# Patient Record
Sex: Female | Born: 1990 | Race: Black or African American | Hispanic: No | Marital: Single
Health system: Midwestern US, Community
[De-identification: ages and names within clinical notes are randomized; demographics above are authoritative.]

## PROBLEM LIST (undated history)

## (undated) DIAGNOSIS — G039 Meningitis, unspecified: Secondary | ICD-10-CM

## (undated) DIAGNOSIS — R0789 Other chest pain: Principal | ICD-10-CM

---
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
## 2012-09-27 ENCOUNTER — Emergency Department (HOSPITAL_COMMUNITY)
Admission: EM | Admit: 2012-09-27 | Discharge: 2012-09-27 | Disposition: A | Discharge status: Home / Self Care | Payer: No Typology Code available for payment source | Attending: Emergency Medicine | Admitting: Emergency Medicine

## 2012-09-27 ENCOUNTER — Encounter (HOSPITAL_COMMUNITY): Payer: Self-pay | Admitting: Emergency Medicine

## 2012-09-27 ENCOUNTER — Emergency Department (HOSPITAL_COMMUNITY): Payer: No Typology Code available for payment source

## 2012-09-27 DIAGNOSIS — IMO0002 Reserved for concepts with insufficient information to code with codable children: Secondary | ICD-10-CM | POA: Insufficient documentation

## 2012-09-27 DIAGNOSIS — Y9389 Activity, other specified: Secondary | ICD-10-CM | POA: Insufficient documentation

## 2012-09-27 DIAGNOSIS — S39012A Strain of muscle, fascia and tendon of lower back, initial encounter: Secondary | ICD-10-CM

## 2012-09-27 MED ORDER — OXYCODONE-ACETAMINOPHEN 5-325 MG PO TABS
2.0000 | ORAL_TABLET | Freq: Once | ORAL | Status: AC
  Administered 2012-09-27: 2 via ORAL
  Filled 2012-09-27: qty 2

## 2012-09-27 MED ORDER — IBUPROFEN 400 MG PO TABS
400.0000 mg | ORAL_TABLET | Freq: Once | ORAL | Status: AC
  Administered 2012-09-27: 400 mg via ORAL
  Filled 2012-09-27: qty 1

## 2012-09-27 MED ORDER — OXYCODONE-ACETAMINOPHEN 5-325 MG PO TABS: 2.0000 | ORAL_TABLET | Freq: Three times a day (TID) | ORAL | Status: DC | PRN
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
## 2014-05-25 ENCOUNTER — Emergency Department (HOSPITAL_COMMUNITY)
Admission: EM | Admit: 2014-05-25 | Discharge: 2014-05-25 | Disposition: A | Discharge status: Home / Self Care | Payer: PRIVATE HEALTH INSURANCE | Attending: Emergency Medicine | Admitting: Emergency Medicine

## 2014-05-25 ENCOUNTER — Emergency Department (HOSPITAL_COMMUNITY): Payer: PRIVATE HEALTH INSURANCE

## 2014-05-25 ENCOUNTER — Encounter (HOSPITAL_COMMUNITY): Payer: Self-pay | Admitting: Emergency Medicine

## 2014-05-25 DIAGNOSIS — W230XXA Caught, crushed, jammed, or pinched between moving objects, initial encounter: Secondary | ICD-10-CM | POA: Insufficient documentation

## 2014-05-25 DIAGNOSIS — S61209A Unspecified open wound of unspecified finger without damage to nail, initial encounter: Secondary | ICD-10-CM | POA: Diagnosis not present

## 2014-05-25 DIAGNOSIS — S6000XA Contusion of unspecified finger without damage to nail, initial encounter: Secondary | ICD-10-CM | POA: Insufficient documentation

## 2014-05-25 DIAGNOSIS — Y9389 Activity, other specified: Secondary | ICD-10-CM | POA: Insufficient documentation

## 2014-05-25 DIAGNOSIS — S6990XA Unspecified injury of unspecified wrist, hand and finger(s), initial encounter: Secondary | ICD-10-CM | POA: Diagnosis present

## 2014-05-25 DIAGNOSIS — S6980XA Other specified injuries of unspecified wrist, hand and finger(s), initial encounter: Secondary | ICD-10-CM | POA: Diagnosis present

## 2014-05-25 DIAGNOSIS — Y9289 Other specified places as the place of occurrence of the external cause: Secondary | ICD-10-CM | POA: Diagnosis not present

## 2014-05-25 DIAGNOSIS — S6010XA Contusion of unspecified finger with damage to nail, initial encounter: Secondary | ICD-10-CM

## 2014-05-25 DIAGNOSIS — S61012A Laceration without foreign body of left thumb without damage to nail, initial encounter: Secondary | ICD-10-CM

## 2014-05-25 MED ORDER — HYDROMORPHONE HCL PF 1 MG/ML IJ SOLN
1.0000 mg | Freq: Once | INTRAMUSCULAR | Status: AC
  Administered 2014-05-25: 1 mg via INTRAMUSCULAR
  Filled 2014-05-25: qty 1

## 2014-05-25 MED ORDER — ACETAMINOPHEN 325 MG PO TABS
650.0000 mg | ORAL_TABLET | Freq: Once | ORAL | Status: AC
  Administered 2014-05-25: 650 mg via ORAL
  Filled 2014-05-25: qty 2

## 2014-05-25 MED ORDER — OXYCODONE-ACETAMINOPHEN 5-325 MG PO TABS: 1.0000 | ORAL_TABLET | Freq: Four times a day (QID) | ORAL | Status: DC | PRN

## 2014-05-25 MED ORDER — OXYCODONE-ACETAMINOPHEN 5-325 MG PO TABS
1.0000 | ORAL_TABLET | Freq: Once | ORAL | Status: AC
  Administered 2014-05-25: 1 via ORAL
  Filled 2014-05-25: qty 1

## 2014-05-25 NOTE — ED Notes (Signed)
Pt. accidentally injured her left thumb against a door this evening  , presents with pain / swelling of left thumb.

## 2014-05-25 NOTE — ED Provider Notes (Signed)
CSN: [id number]     Arrival date & time 05/25/14  0215 History   First MD Initiated Contact with Patient 05/25/14 [phone]     Chief Complaint  Patient presents with  . Finger Injury     (Consider location/radiation/quality/duration/timing/severity/associated sxs/prior Treatment) Patient is a 23 y.o. female presenting with hand injury. The history is provided by the patient.  Hand Injury Location:  Finger Time since incident:  1 hour Injury: yes   Mechanism of injury: crush   Crush injury:    Mechanism:  Door Finger location:  L thumb Pain details:    Quality:  Aching   Radiates to:  Does not radiate   Severity:  Severe   Onset quality:  Sudden   Duration:  1 hour   Timing:  Constant   Progression:  Unchanged Chronicity:  New Handedness:  Right-handed Dislocation: no   Foreign body present:  No foreign bodies Tetanus status:  Up to date Prior injury to area:  No Relieved by:  Nothing Worsened by:  Nothing tried Ineffective treatments:  None tried Associated symptoms: no back pain, no fatigue, no fever and no neck pain     History reviewed. No pertinent past medical history. History reviewed. No pertinent past surgical history. No family history on file. History  Substance Use Topics  . Smoking status: Never Smoker   . Smokeless tobacco: Not on file  . Alcohol Use: Yes   OB History   Grav Para Term Preterm Abortions TAB SAB Ect Mult Living                 Review of Systems  Constitutional: Negative for fever and fatigue.  HENT: Negative for congestion and drooling.   Eyes: Negative for pain.  Respiratory: Negative for cough and shortness of breath.   Cardiovascular: Negative for chest pain.  Gastrointestinal: Negative for nausea, vomiting, abdominal pain and diarrhea.  Genitourinary: Negative for dysuria and hematuria.  Musculoskeletal: Negative for back pain, gait problem and neck pain.  Skin: Negative for color change.  Neurological: Negative for dizziness  and headaches.  Hematological: Negative for adenopathy.  Psychiatric/Behavioral: Negative for behavioral problems.  All other systems reviewed and are negative.     Allergies  Hydrocodone  Home Medications   Prior to Admission medications   Medication Sig Start Date End Date Taking? Authorizing Provider  oxyCODONE-acetaminophen (PERCOCET) 5-325 MG per tablet Take 2 tablets by mouth every 8 (eight) hours as needed for pain. 09/27/12   Hurman HornJohn M Bednar, MD   BP 112/76  Pulse 107  Temp(Src) 98.7 F (37.1 C) (Oral)  Resp 14  SpO2 100%  LMP 05/09/2014 Physical Exam  Nursing note and vitals reviewed. Constitutional: She is oriented to person, place, and time. She appears well-developed and well-nourished.  HENT:  Head: Normocephalic and atraumatic.  Mouth/Throat: Oropharynx is clear and moist. No oropharyngeal exudate.  Eyes: Conjunctivae and EOM are normal. Pupils are equal, round, and reactive to light.  Neck: Normal range of motion. Neck supple.  Cardiovascular: Normal rate, regular rhythm, normal heart sounds and intact distal pulses.  Exam reveals no gallop and no friction rub.   No murmur heard. Pulmonary/Chest: Effort normal and breath sounds normal. No respiratory distress. She has no wheezes.  Abdominal: Soft. Bowel sounds are normal. There is no tenderness. There is no rebound and no guarding.  Musculoskeletal: Normal range of motion. She exhibits no edema and no tenderness.  1.5 cm superficial well approximated laceration to the distal phalanx of the  left thumb on the bowl or aspect.  Subungual hematoma is noted beneath the nail plate of the left thumb. It occupies approximately 70% of the nailbed.  No obvious motor or sensory deficits in the left upper extremity. 2+ distal pulses in the left upper extremity.  Neurological: She is alert and oriented to person, place, and time.  Skin: Skin is warm and dry.  Psychiatric: She has a normal mood and affect. Her behavior is  normal.    ED Course  Procedures (including critical care time) Labs Review Labs Reviewed - No data to display  Imaging Review Dg Finger Thumb Left  05/25/2014   CLINICAL DATA:  Pain MCP joint after shutting left thumb in car door.  EXAM: LEFT THUMB 2+V  COMPARISON:  None.  FINDINGS: There is no evidence of fracture or dislocation. There is no evidence of arthropathy or other focal bone abnormality. Soft tissues are unremarkable  IMPRESSION: Negative.   Electronically Signed   By: Burman Nieves M.D.   On: 05/25/2014 03:02     EKG Interpretation None      MDM   Final diagnoses:  Laceration of left thumb, initial encounter  Subungual hematoma of digit of hand, initial encounter    3:34 AM 23 y.o. female who presents after crashing her left thumb in a door prior to arrival. Her exam is typical as she is in a lot of pain. Will give her a dose of IM Dilaudid. She appears to have a subungual hematoma as well as a 1.5 cm laceration on the volar aspect of the thumb. She believes her tetanus to be up-to-date.  5:54 AM: Laceration does not need repair as it is superficial and well approximated. Plain film non-contrib. Pt elected not to have trephination of the subungual hematoma.  I have discussed the diagnosis/risks/treatment options with the patient and believe the pt to be eligible for discharge home to follow-up with pcp as needed. We also discussed returning to the ED immediately if new or worsening sx occur. We discussed the sx which are most concerning (e.g., worsening pain, concern for infection) that necessitate immediate return. Medications administered to the patient during their visit and any new prescriptions provided to the patient are listed below.  Medications given during this visit Medications  acetaminophen (TYLENOL) tablet 650 mg (650 mg Oral Given 05/25/14 0326)  HYDROmorphone (DILAUDID) injection 1 mg (1 mg Intramuscular Given 05/25/14 0350)  oxyCODONE-acetaminophen  (PERCOCET/ROXICET) 5-325 MG per tablet 1 tablet (1 tablet Oral Given 05/25/14 0451)    There are no discharge medications for this patient.    Junius Argyle, MD 05/25/14 [phone]

## 2014-05-31 ENCOUNTER — Emergency Department (HOSPITAL_COMMUNITY)
Admission: EM | Admit: 2014-05-31 | Discharge: 2014-05-31 | Disposition: A | Discharge status: Home / Self Care | Payer: PRIVATE HEALTH INSURANCE | Attending: Emergency Medicine | Admitting: Emergency Medicine

## 2014-05-31 ENCOUNTER — Encounter (HOSPITAL_COMMUNITY): Payer: Self-pay | Admitting: Emergency Medicine

## 2014-05-31 DIAGNOSIS — Y9389 Activity, other specified: Secondary | ICD-10-CM | POA: Diagnosis not present

## 2014-05-31 DIAGNOSIS — S6000XA Contusion of unspecified finger without damage to nail, initial encounter: Secondary | ICD-10-CM | POA: Diagnosis not present

## 2014-05-31 DIAGNOSIS — Y9289 Other specified places as the place of occurrence of the external cause: Secondary | ICD-10-CM | POA: Insufficient documentation

## 2014-05-31 DIAGNOSIS — S60112A Contusion of left thumb with damage to nail, initial encounter: Secondary | ICD-10-CM

## 2014-05-31 DIAGNOSIS — W230XXA Caught, crushed, jammed, or pinched between moving objects, initial encounter: Secondary | ICD-10-CM | POA: Diagnosis not present

## 2014-05-31 DIAGNOSIS — S6990XA Unspecified injury of unspecified wrist, hand and finger(s), initial encounter: Secondary | ICD-10-CM | POA: Diagnosis present

## 2014-05-31 DIAGNOSIS — S6980XA Other specified injuries of unspecified wrist, hand and finger(s), initial encounter: Secondary | ICD-10-CM | POA: Diagnosis present

## 2014-05-31 MED ORDER — OXYCODONE-ACETAMINOPHEN 5-325 MG PO TABS: 1.0000 | ORAL_TABLET | ORAL | Status: DC | PRN

## 2014-05-31 MED ORDER — ACETAMINOPHEN 325 MG PO TABS
650.0000 mg | ORAL_TABLET | Freq: Once | ORAL | Status: AC
  Administered 2014-05-31: 650 mg via ORAL
  Filled 2014-05-31: qty 2

## 2014-05-31 MED ORDER — CEPHALEXIN 500 MG PO CAPS: 500.0000 mg | ORAL_CAPSULE | Freq: Four times a day (QID) | ORAL | Status: DC

## 2014-05-31 NOTE — ED Provider Notes (Signed)
Medical screening examination/treatment/procedure(s) were performed by non-physician practitioner and as supervising physician I was immediately available for consultation/collaboration.  Toy BakerAnthony T Nanako Stopher, MD 05/31/14 [phone]

## 2014-05-31 NOTE — ED Notes (Signed)
Pt states last Wednesday shut L thumb in car door, was seen at [place], given pain medication and a prescription, was told if doesn't get any better then can get it drained, pt states still in a lot of pain and it's still swollen

## 2014-05-31 NOTE — ED Provider Notes (Signed)
CSN: [id number]     Arrival date & time 05/31/14  0810 History   First MD Initiated Contact with Patient 05/31/14 0818     Chief Complaint  Patient presents with  . Finger Injury    L thumb     (Consider location/radiation/quality/duration/timing/severity/associated sxs/prior Treatment) HPI Comments: Patient is a 23 year old female who presents with left thumb pain for 1 week. Patient reports shutting her thumb in a car door 1 week ago. She was seen at [facility] ED where she had an unremarkable xray. Patient was sent home with pain medication and instructed to return to have her thumb drained if the pain does not improve. Patient reports continued throbbing and severe pain of her left thumb that does not radiate. No new injury. Palpation and movement makes the pain worse. No alleviating factors.    History reviewed. No pertinent past medical history. History reviewed. No pertinent past surgical history. No family history on file. History  Substance Use Topics  . Smoking status: Never Smoker   . Smokeless tobacco: Not on file  . Alcohol Use: Yes   OB History   Grav Para Term Preterm Abortions TAB SAB Ect Mult Living                 Review of Systems  Constitutional: Negative for fever, chills and fatigue.  HENT: Negative for trouble swallowing.   Eyes: Negative for visual disturbance.  Respiratory: Negative for shortness of breath.   Cardiovascular: Negative for chest pain and palpitations.  Gastrointestinal: Negative for nausea, vomiting, abdominal pain and diarrhea.  Genitourinary: Negative for dysuria and difficulty urinating.  Musculoskeletal: Positive for arthralgias and joint swelling. Negative for neck pain.  Skin: Negative for color change.  Neurological: Negative for dizziness and weakness.  Psychiatric/Behavioral: Negative for dysphoric mood.      Allergies  Apple fruit extract and Hydrocodone  Home Medications   Prior to Admission medications   Medication  Sig Start Date End Date Taking? Authorizing Provider  ibuprofen (ADVIL,MOTRIN) 200 MG tablet Take 400 mg by mouth every 6 (six) hours as needed for moderate pain.   Yes Historical Provider, MD  oxyCODONE-acetaminophen (PERCOCET) 5-325 MG per tablet Take 1 tablet by mouth every 6 (six) hours as needed for moderate pain. 05/25/14  Yes Junius Argyle, MD   BP 112/72  Pulse 74  Temp(Src) 97.9 F (36.6 C) (Oral)  Resp 18  Ht 5\' 3"  (1.6 m)  Wt 134 lb (60.782 kg)  BMI 23.74 kg/m2  SpO2 100%  LMP 05/09/2014 Physical Exam  Nursing note and vitals reviewed. Constitutional: She appears well-developed and well-nourished. No distress.  HENT:  Head: Normocephalic and atraumatic.  Eyes: Conjunctivae and EOM are normal.  Neck: Normal range of motion.  Cardiovascular: Normal rate and regular rhythm.  Exam reveals no gallop and no friction rub.   No murmur heard. Pulmonary/Chest: Effort normal and breath sounds normal. She has no wheezes. She has no rales. She exhibits no tenderness.  Musculoskeletal:  Generalized edema and erythema of left thumb. Limited ROM of left thumb due to pain. Generalized tenderness to palpation of left thumb. subungual hematoma noted of left thumb.   Neurological: She is alert. Coordination normal.  Speech is goal-oriented. Moves limbs without ataxia.   Skin: Skin is warm and dry.  There is a healed laceration of finger pad of left thumb.   Psychiatric: She has a normal mood and affect. Her behavior is normal.    ED Course  Procedures (including critical care time)   PROCEDURE NOTE for left thumb nail trephination. Performed 9:24 AM on 05/31/2014 Performed by Emilia BeckKaitlyn Ervin Rothbauer I used a high temperature, fine tip cautery to place 2 holes in left thumb nail bed Patient reports immediate relief No complications Drainage is serosanguinous   Labs Review Labs Reviewed - No data to display  Imaging Review No results found.   EKG Interpretation None      MDM    Final diagnoses:  Subungual hematoma of left thumb, initial encounter    9:18 AM Patient's hematoma drained without complication. Patient will have antibiotics and tylenol here. Patient will be discharged with Percocet.       Emilia BeckKaitlyn Hania Cerone, PA-C 05/31/14 [phone]

## 2014-05-31 NOTE — Discharge Instructions (Signed)
Take keflex as directed until gone. Take Percocet as needed for pain. Return to the ED with worsening or concerning symptoms.

## 2014-05-31 NOTE — ED Notes (Signed)
Patient was educated not to drive, operate heavy machinery, or drink alcohol while taking narcotic medication.  

## 2014-11-13 ENCOUNTER — Encounter (HOSPITAL_COMMUNITY): Payer: Self-pay

## 2014-11-13 ENCOUNTER — Emergency Department (HOSPITAL_COMMUNITY)
Admission: EM | Admit: 2014-11-13 | Discharge: 2014-11-13 | Disposition: A | Discharge status: Home / Self Care | Payer: 59 | Attending: Emergency Medicine | Admitting: Emergency Medicine

## 2014-11-13 ENCOUNTER — Emergency Department (HOSPITAL_COMMUNITY): Payer: 59

## 2014-11-13 DIAGNOSIS — Z792 Long term (current) use of antibiotics: Secondary | ICD-10-CM | POA: Insufficient documentation

## 2014-11-13 DIAGNOSIS — N946 Dysmenorrhea, unspecified: Secondary | ICD-10-CM | POA: Insufficient documentation

## 2014-11-13 DIAGNOSIS — R1032 Left lower quadrant pain: Secondary | ICD-10-CM | POA: Diagnosis present

## 2014-11-13 DIAGNOSIS — Z3202 Encounter for pregnancy test, result negative: Secondary | ICD-10-CM | POA: Insufficient documentation

## 2014-11-13 DIAGNOSIS — R109 Unspecified abdominal pain: Secondary | ICD-10-CM

## 2014-11-13 LAB — CBC WITH DIFFERENTIAL/PLATELET
BASOS ABS: 0 10*3/uL (ref 0.0–0.1)
Basophils Relative: 0 % (ref 0–1)
Eosinophils Absolute: 0.2 10*3/uL (ref 0.0–0.7)
Eosinophils Relative: 2 % (ref 0–5)
HCT: 36.5 % (ref 36.0–46.0)
Hemoglobin: 12.1 g/dL (ref 12.0–15.0)
LYMPHS ABS: 1.1 10*3/uL (ref 0.7–4.0)
LYMPHS PCT: 13 % (ref 12–46)
MCH: 29.7 pg (ref 26.0–34.0)
MCHC: 33.2 g/dL (ref 30.0–36.0)
MCV: 89.5 fL (ref 78.0–100.0)
Monocytes Absolute: 0.4 10*3/uL (ref 0.1–1.0)
Monocytes Relative: 5 % (ref 3–12)
NEUTROS ABS: 6.8 10*3/uL (ref 1.7–7.7)
Neutrophils Relative %: 80 % — ABNORMAL HIGH (ref 43–77)
PLATELETS: 247 10*3/uL (ref 150–400)
RBC: 4.08 MIL/uL (ref 3.87–5.11)
RDW: 12.8 % (ref 11.5–15.5)
WBC: 8.5 10*3/uL (ref 4.0–10.5)

## 2014-11-13 LAB — URINE MICROSCOPIC-ADD ON

## 2014-11-13 LAB — URINALYSIS, ROUTINE W REFLEX MICROSCOPIC
BILIRUBIN URINE: NEGATIVE
GLUCOSE, UA: NEGATIVE mg/dL
Ketones, ur: NEGATIVE mg/dL
Leukocytes, UA: NEGATIVE
Nitrite: NEGATIVE
PROTEIN: NEGATIVE mg/dL
Specific Gravity, Urine: 1.028 (ref 1.005–1.030)
Urobilinogen, UA: 1 mg/dL (ref 0.0–1.0)
pH: 6 (ref 5.0–8.0)

## 2014-11-13 LAB — COMPREHENSIVE METABOLIC PANEL
ALK PHOS: 52 U/L (ref 39–117)
ALT: 10 U/L (ref 0–35)
AST: 17 U/L (ref 0–37)
Albumin: 4.6 g/dL (ref 3.5–5.2)
Anion gap: 7 (ref 5–15)
BILIRUBIN TOTAL: 1 mg/dL (ref 0.3–1.2)
BUN: 11 mg/dL (ref 6–23)
CALCIUM: 9.2 mg/dL (ref 8.4–10.5)
CHLORIDE: 107 meq/L (ref 96–112)
CO2: 24 mmol/L (ref 19–32)
Creatinine, Ser: 0.66 mg/dL (ref 0.50–1.10)
GFR calc Af Amer: >90 mL/min (ref >90)
GLUCOSE: 101 mg/dL — AB (ref 70–99)
POTASSIUM: 3.7 mmol/L (ref 3.5–5.1)
SODIUM: 138 mmol/L (ref 135–145)
Total Protein: 8 g/dL (ref 6.0–8.3)

## 2014-11-13 LAB — WET PREP, GENITAL
Clue Cells Wet Prep HPF POC: NONE SEEN
Trich, Wet Prep: NONE SEEN
Yeast Wet Prep HPF POC: NONE SEEN

## 2014-11-13 LAB — PREGNANCY, URINE: PREG TEST UR: NEGATIVE

## 2014-11-13 MED ORDER — IBUPROFEN 800 MG PO TABS: 800.0000 mg | ORAL_TABLET | Freq: Three times a day (TID) | ORAL | Status: DC

## 2014-11-13 NOTE — ED Notes (Signed)
PER [facility]# 16. Pt called EMS for c/o lower abdominal pain x 5 hours. Denies NVD. VS stable

## 2014-11-13 NOTE — ED Provider Notes (Signed)
CSN: [id number]     Arrival date & time 11/13/14  1253 History   First MD Initiated Contact with Patient 11/13/14 1537     Chief Complaint  Patient presents with  . Abdominal Pain    5 hour hx , c/o right and l/lower quad abdominal pain. Denies NVD     (Consider location/radiation/quality/duration/timing/severity/associated sxs/prior Treatment) Patient is a 23 y.o. female presenting with abdominal pain. The history is provided by the patient.  Abdominal Pain Pain location:  Suprapubic, LLQ and RLQ Pain quality: aching and sharp   Pain severity:  Severe Onset quality:  Sudden Timing:  Constant Progression:  Resolved Chronicity:  New Context: not recent sexual activity, not retching and not trauma   Context comment:  Began her menstrual cycle this morning Relieved by:  Nothing Worsened by:  Nothing tried Ineffective treatments:  NSAIDs Associated symptoms: no cough, no fever, no shortness of breath and no vomiting     History reviewed. No pertinent past medical history. History reviewed. No pertinent past surgical history. History reviewed. No pertinent family history. History  Substance Use Topics  . Smoking status: Never Smoker   . Smokeless tobacco: Not on file  . Alcohol Use: Yes   OB History    No data available     Review of Systems  Constitutional: Negative for fever.  Respiratory: Negative for cough and shortness of breath.   Gastrointestinal: Positive for abdominal pain. Negative for vomiting.  All other systems reviewed and are negative.     Allergies  Apple fruit extract and Hydrocodone  Home Medications   Prior to Admission medications   Medication Sig Start Date End Date Taking? Authorizing Provider  ibuprofen (ADVIL,MOTRIN) 200 MG tablet Take 400 mg by mouth every 6 (six) hours as needed for moderate pain (menstrual cramps).    Yes Historical Provider, MD  cephALEXin (KEFLEX) 500 MG capsule Take 1 capsule (500 mg total) by mouth 4 (four) times  daily. Patient not taking: Reported on 11/13/2014 05/31/14   Emilia Beck, PA-C  oxyCODONE-acetaminophen (PERCOCET) 5-325 MG per tablet Take 1 tablet by mouth every 6 (six) hours as needed for moderate pain. 05/25/14   Purvis Sheffield, MD  oxyCODONE-acetaminophen (PERCOCET/ROXICET) 5-325 MG per tablet Take 1-2 tablets by mouth every 4 (four) hours as needed for moderate pain or severe pain. Patient not taking: Reported on 11/13/2014 05/31/14   Kaitlyn Szekalski, PA-C   BP 118/70 mmHg  Pulse 71  Temp(Src) 97.6 F (36.4 C) (Oral)  Resp 16  SpO2 100%  LMP  (LMP Unknown) Physical Exam  Constitutional: She is oriented to person, place, and time. She appears well-developed and well-nourished. No distress.  HENT:  Head: Normocephalic and atraumatic.  Mouth/Throat: Oropharynx is clear and moist.  Eyes: EOM are normal. Pupils are equal, round, and reactive to light.  Neck: Normal range of motion. Neck supple.  Cardiovascular: Normal rate and regular rhythm.  Exam reveals no friction rub.   No murmur heard. Pulmonary/Chest: Effort normal and breath sounds normal. No respiratory distress. She has no wheezes. She has no rales.  Abdominal: Soft. She exhibits no distension. There is no tenderness. There is no rebound.  Musculoskeletal: Normal range of motion. She exhibits no edema.  Neurological: She is alert and oriented to person, place, and time. No cranial nerve deficit. She exhibits normal muscle tone. Coordination normal.  Skin: No rash noted. She is not diaphoretic.  Nursing note and vitals reviewed.   ED Course  Procedures (including critical care time)  Labs Review Labs Reviewed  CBC WITH DIFFERENTIAL - Abnormal; Notable for the following:    Neutrophils Relative % 80 (*)    All other components within normal limits  COMPREHENSIVE METABOLIC PANEL - Abnormal; Notable for the following:    Glucose, Bld 101 (*)    All other components within normal limits  URINALYSIS, ROUTINE W  REFLEX MICROSCOPIC - Abnormal; Notable for the following:    Color, Urine AMBER (*)    Hgb urine dipstick LARGE (*)    All other components within normal limits  URINE MICROSCOPIC-ADD ON - Abnormal; Notable for the following:    Squamous Epithelial / LPF MANY (*)    Bacteria, UA FEW (*)    All other components within normal limits  PREGNANCY, URINE    Imaging Review [place] Transvaginal Non-ob  11/13/2014   CLINICAL DATA:  Bilateral pelvic pain for 1 day.  Question torsion.  EXAM: TRANSABDOMINAL AND TRANSVAGINAL ULTRASOUND OF PELVIS  DOPPLER ULTRASOUND OF OVARIES  TECHNIQUE: Both transabdominal and transvaginal ultrasound examinations of the pelvis were performed. Transabdominal technique was performed for global imaging of the pelvis including uterus, ovaries, adnexal regions, and pelvic cul-de-sac.  It was necessary to proceed with endovaginal exam following the transabdominal exam to visualize the uterus, endometrium, ovaries and adnexa . Color and duplex Doppler ultrasound was utilized to evaluate blood flow to the ovaries.  COMPARISON:  None.  FINDINGS: Uterus  Measurements: 8.1 x 3.5 x 4.3 cm. No fibroids or other mass visualized.  Endometrium  Thickness: 4 mm in thickness.  No focal abnormality visualized.  Right ovary  Measurements: 4.6 x 2.0 x 2.8 cm. Normal appearance/no adnexal mass.  Left ovary  Measurements: 3.7 x 1.9 x 2.4 cm. Normal appearance/no adnexal mass.  Pulsed Doppler evaluation of both ovaries demonstrates normal low-resistance arterial and venous waveforms.  Other findings  Small amount of free fluid in the pelvis.  IMPRESSION: Unremarkable pelvic ultrasound.  No evidence of ovarian torsion.   Electronically Signed   By: Charlett NoseKevin  Dover M.D.   On: 11/13/2014 17:27   [place] Pelvis Complete  11/13/2014   CLINICAL DATA:  Bilateral pelvic pain for 1 day.  Question torsion.  EXAM: TRANSABDOMINAL AND TRANSVAGINAL ULTRASOUND OF PELVIS  DOPPLER ULTRASOUND OF OVARIES  TECHNIQUE: Both  transabdominal and transvaginal ultrasound examinations of the pelvis were performed. Transabdominal technique was performed for global imaging of the pelvis including uterus, ovaries, adnexal regions, and pelvic cul-de-sac.  It was necessary to proceed with endovaginal exam following the transabdominal exam to visualize the uterus, endometrium, ovaries and adnexa . Color and duplex Doppler ultrasound was utilized to evaluate blood flow to the ovaries.  COMPARISON:  None.  FINDINGS: Uterus  Measurements: 8.1 x 3.5 x 4.3 cm. No fibroids or other mass visualized.  Endometrium  Thickness: 4 mm in thickness.  No focal abnormality visualized.  Right ovary  Measurements: 4.6 x 2.0 x 2.8 cm. Normal appearance/no adnexal mass.  Left ovary  Measurements: 3.7 x 1.9 x 2.4 cm. Normal appearance/no adnexal mass.  Pulsed Doppler evaluation of both ovaries demonstrates normal low-resistance arterial and venous waveforms.  Other findings  Small amount of free fluid in the pelvis.  IMPRESSION: Unremarkable pelvic ultrasound.  No evidence of ovarian torsion.   Electronically Signed   By: Charlett NoseKevin  Dover M.D.   On: 11/13/2014 17:27   [place] Art/ven Flow Abd Pelv Doppler  11/13/2014   CLINICAL DATA:  Bilateral pelvic pain for 1 day.  Question torsion.  EXAM: TRANSABDOMINAL AND  TRANSVAGINAL ULTRASOUND OF PELVIS  DOPPLER ULTRASOUND OF OVARIES  TECHNIQUE: Both transabdominal and transvaginal ultrasound examinations of the pelvis were performed. Transabdominal technique was performed for global imaging of the pelvis including uterus, ovaries, adnexal regions, and pelvic cul-de-sac.  It was necessary to proceed with endovaginal exam following the transabdominal exam to visualize the uterus, endometrium, ovaries and adnexa . Color and duplex Doppler ultrasound was utilized to evaluate blood flow to the ovaries.  COMPARISON:  None.  FINDINGS: Uterus  Measurements: 8.1 x 3.5 x 4.3 cm. No fibroids or other mass visualized.  Endometrium  Thickness:  4 mm in thickness.  No focal abnormality visualized.  Right ovary  Measurements: 4.6 x 2.0 x 2.8 cm. Normal appearance/no adnexal mass.  Left ovary  Measurements: 3.7 x 1.9 x 2.4 cm. Normal appearance/no adnexal mass.  Pulsed Doppler evaluation of both ovaries demonstrates normal low-resistance arterial and venous waveforms.  Other findings  Small amount of free fluid in the pelvis.  IMPRESSION: Unremarkable pelvic ultrasound.  No evidence of ovarian torsion.   Electronically Signed   By: Charlett NoseKevin  Dover M.D.   On: 11/13/2014 17:27     EKG Interpretation None      MDM   Final diagnoses:  Abdominal pain  Dysmenorrhea    31F here with abdominal pain. Began this morning, gone now. Lasted about 6 hours. No hx of ovarian cysts. Did start her menstrual cycle this morning. Has never had menstrual cramps like this before. No N/V, no vaginal discharge. No dysuria.  Abdominal exam benign here. On pelvic exam mild bilateral adnexal tenderness, no uterine tenderness. Mild bleeding from cervix. US normal, no sign of torsion or cyst. Stable for discharge.  Elwin MochaBlair Selso Mannor, MD 11/13/14 [phone]

## 2014-11-13 NOTE — Discharge Instructions (Signed)

## 2014-11-14 LAB — GC/CHLAMYDIA PROBE AMP
CT Probe RNA: NEGATIVE
GC PROBE AMP APTIMA: NEGATIVE

## 2015-08-21 ENCOUNTER — Encounter (HOSPITAL_COMMUNITY): Payer: Self-pay | Admitting: Emergency Medicine

## 2015-08-21 ENCOUNTER — Emergency Department (HOSPITAL_COMMUNITY)
Admission: EM | Admit: 2015-08-21 | Discharge: 2015-08-21 | Disposition: A | Discharge status: Home / Self Care | Payer: BLUE CROSS/BLUE SHIELD | Attending: Emergency Medicine | Admitting: Emergency Medicine

## 2015-08-21 DIAGNOSIS — Z79899 Other long term (current) drug therapy: Secondary | ICD-10-CM | POA: Insufficient documentation

## 2015-08-21 DIAGNOSIS — N760 Acute vaginitis: Secondary | ICD-10-CM | POA: Insufficient documentation

## 2015-08-21 DIAGNOSIS — N898 Other specified noninflammatory disorders of vagina: Secondary | ICD-10-CM | POA: Diagnosis present

## 2015-08-21 DIAGNOSIS — B9689 Other specified bacterial agents as the cause of diseases classified elsewhere: Secondary | ICD-10-CM

## 2015-08-21 LAB — WET PREP, GENITAL
Trich, Wet Prep: NONE SEEN
Yeast Wet Prep HPF POC: NONE SEEN

## 2015-08-21 NOTE — ED Notes (Signed)
Per pt, states she was given diflucan for yeast infection-used monistat with no relief-diagnosed with BV and given antibiotic and thinks she is having a reaction-swelling and discomfort

## 2015-08-21 NOTE — Discharge Instructions (Signed)

## 2015-08-21 NOTE — ED Notes (Addendum)
Pelvic cart @ [facility] & set up is complete.  Pt stated unable to provide UA at this time.

## 2015-08-21 NOTE — ED Notes (Signed)
Bed: WA18 Expected date:  Expected time:  Means of arrival:  Comments: Hold for triage 

## 2015-08-21 NOTE — ED Provider Notes (Signed)
CSN: [id number]     Arrival date & time 08/21/15  1438 History   First MD Initiated Contact with Patient 08/21/15 1633     Chief Complaint  Patient presents with  . vaginal swelling/irritation      (Consider location/radiation/quality/duration/timing/severity/associated sxs/prior Treatment) HPI Comments: Patient presents to the ED with a chief complaint of vaginal discharge, itching, and irritation.  She states that the symptoms started about a week ago.  She states that she has tried taking diflucan, monostat, and recently started flagyl yesterday which was started at and urgent care.  She denies fevers, chills, or nausea.  There are not other associated symptoms.  The symptoms are aggravated with itching.  The history is provided by the patient. No language interpreter was used.    History reviewed. No pertinent past medical history. History reviewed. No pertinent past surgical history. No family history on file. Social History  Substance Use Topics  . Smoking status: Never Smoker   . Smokeless tobacco: None  . Alcohol Use: Yes   OB History    No data available     Review of Systems  Constitutional: Negative for fever and chills.  Respiratory: Negative for shortness of breath.   Cardiovascular: Negative for chest pain.  Gastrointestinal: Negative for nausea, vomiting, diarrhea and constipation.  Genitourinary: Positive for vaginal discharge. Negative for dysuria.      Allergies  Apple fruit extract and Hydrocodone  Home Medications   Prior to Admission medications   Medication Sig Start Date End Date Taking? Authorizing Provider  ibuprofen (ADVIL,MOTRIN) 200 MG tablet Take 400 mg by mouth every 6 (six) hours as needed for moderate pain (menstrual cramps).    Yes Historical Provider, MD  metroNIDAZOLE (FLAGYL) 500 MG tablet Take 500 mg by mouth 2 (two) times daily. 08/20/15  Yes Historical Provider, MD  cephALEXin (KEFLEX) 500 MG capsule Take 1 capsule (500 mg total) by  mouth 4 (four) times daily. Patient not taking: Reported on 11/13/2014 05/31/14   Emilia Beck, PA-C  fluconazole (DIFLUCAN) 150 MG tablet Take 150 mg by mouth daily. 08/16/15   Historical Provider, MD  ibuprofen (ADVIL,MOTRIN) 800 MG tablet Take 1 tablet (800 mg total) by mouth 3 (three) times daily. Patient not taking: Reported on 08/21/2015 11/13/14   Elwin Mocha, MD  oxyCODONE-acetaminophen (PERCOCET) 5-325 MG per tablet Take 1 tablet by mouth every 6 (six) hours as needed for moderate pain. Patient not taking: Reported on 08/21/2015 05/25/14   Purvis Sheffield, MD  oxyCODONE-acetaminophen (PERCOCET/ROXICET) 5-325 MG per tablet Take 1-2 tablets by mouth every 4 (four) hours as needed for moderate pain or severe pain. Patient not taking: Reported on 11/13/2014 05/31/14   Emilia Beck, PA-C   BP 118/79 mmHg  Pulse 91  Temp(Src) 98.1 F (36.7 C) (Oral)  Resp 16  SpO2 99%  LMP 08/01/2015 Physical Exam  Constitutional: She is oriented to person, place, and time. She appears well-developed and well-nourished.  HENT:  Head: Normocephalic and atraumatic.  Eyes: Conjunctivae and EOM are normal.  Neck: Normal range of motion.  Cardiovascular: Normal rate.   Pulmonary/Chest: Effort normal.  Abdominal: She exhibits no distension.  Genitourinary:  Pelvic exam chaperoned by female ER tech, no right or left adnexal tenderness, no uterine tenderness, moderate vaginal discharge, no bleeding, no CMT or friability, no foreign body, no injury to the external genitalia, but mild swelling is noted, no other significant findings   Musculoskeletal: Normal range of motion.  Neurological: She is alert and oriented to person,  place, and time.  Skin: Skin is dry.  Psychiatric: She has a normal mood and affect. Her behavior is normal. Judgment and thought content normal.  Nursing note and vitals reviewed.   ED Course  Procedures (including critical care time) Results for orders placed or performed  during the hospital encounter of 08/21/15  Wet prep, genital  Result Value Ref Range   Yeast Wet Prep HPF POC NONE SEEN NONE SEEN   Trich, Wet Prep NONE SEEN NONE SEEN   Clue Cells Wet Prep HPF POC MANY (A) NONE SEEN   WBC, Wet Prep HPF POC RARE (A) NONE SEEN   No results found.    MDM   Final diagnoses:  BV (bacterial vaginosis)    Patient with vaginal discharge.  Just started treatment for BV.  Pelvic exam remarkable for moderate vaginal discharge, mild swelling. Many clue cells seen on wet prep. Recommend continuing treatment with metronidazole. Patient understands and agrees to plan. Return precautions discussed. Advised follow-up with OB/GYN.    Roxy Horseman, PA-C 08/22/15 0008  Lorre Nick, MD 08/23/15 [phone]

## 2015-08-21 NOTE — ED Notes (Signed)
Informed the pt that a urine specimen is needed. 

## 2015-08-22 LAB — GC/CHLAMYDIA PROBE AMP (~~LOC~~) NOT AT ARMC
CHLAMYDIA, DNA PROBE: NEGATIVE
NEISSERIA GONORRHEA: NEGATIVE

## 2015-09-15 ENCOUNTER — Emergency Department (HOSPITAL_COMMUNITY): Payer: BLUE CROSS/BLUE SHIELD

## 2015-09-15 ENCOUNTER — Encounter (HOSPITAL_COMMUNITY): Payer: Self-pay | Admitting: Emergency Medicine

## 2015-09-15 ENCOUNTER — Emergency Department (HOSPITAL_COMMUNITY)
Admission: EM | Admit: 2015-09-15 | Discharge: 2015-09-15 | Disposition: A | Discharge status: Home / Self Care | Payer: BLUE CROSS/BLUE SHIELD | Attending: Emergency Medicine | Admitting: Emergency Medicine

## 2015-09-15 DIAGNOSIS — Z79899 Other long term (current) drug therapy: Secondary | ICD-10-CM | POA: Diagnosis not present

## 2015-09-15 DIAGNOSIS — Z8661 Personal history of infections of the central nervous system: Secondary | ICD-10-CM | POA: Diagnosis not present

## 2015-09-15 DIAGNOSIS — R000 Tachycardia, unspecified: Secondary | ICD-10-CM | POA: Insufficient documentation

## 2015-09-15 DIAGNOSIS — J029 Acute pharyngitis, unspecified: Secondary | ICD-10-CM | POA: Diagnosis present

## 2015-09-15 DIAGNOSIS — J039 Acute tonsillitis, unspecified: Secondary | ICD-10-CM | POA: Diagnosis not present

## 2015-09-15 DIAGNOSIS — R531 Weakness: Secondary | ICD-10-CM | POA: Insufficient documentation

## 2015-09-15 HISTORY — DX: Meningitis, unspecified: G03.9

## 2015-09-15 LAB — BASIC METABOLIC PANEL
ANION GAP: 11 (ref 5–15)
BUN: 11 mg/dL (ref 6–20)
CO2: 22 mmol/L (ref 22–32)
Calcium: 9.5 mg/dL (ref 8.9–10.3)
Chloride: 103 mmol/L (ref 101–111)
Creatinine, Ser: 0.79 mg/dL (ref 0.44–1.00)
GFR calc Af Amer: >60 mL/min (ref >60)
Glucose, Bld: 93 mg/dL (ref 65–99)
POTASSIUM: 3.5 mmol/L (ref 3.5–5.1)
SODIUM: 136 mmol/L (ref 135–145)

## 2015-09-15 LAB — RAPID STREP SCREEN (MED CTR MEBANE ONLY): STREPTOCOCCUS, GROUP A SCREEN (DIRECT): NEGATIVE

## 2015-09-15 MED ORDER — PENICILLIN G BENZATHINE 1200000 UNIT/2ML IM SUSP
1.2000 10*6.[IU] | Freq: Once | INTRAMUSCULAR | Status: AC
  Administered 2015-09-15: 1.2 10*6.[IU] via INTRAMUSCULAR
  Filled 2015-09-15: qty 2

## 2015-09-15 MED ORDER — IBUPROFEN 800 MG PO TABS
800.0000 mg | ORAL_TABLET | Freq: Once | ORAL | Status: AC
Start: 2015-09-15 — End: 2015-09-15
  Administered 2015-09-15: 800 mg via ORAL
  Filled 2015-09-15: qty 1

## 2015-09-15 MED ORDER — IOHEXOL 300 MG/ML  SOLN: 100.0000 mL | Freq: Once | INTRAMUSCULAR | Status: DC | PRN

## 2015-09-15 NOTE — ED Provider Notes (Signed)
CSN: [id number]   Arrival date & time 09/15/15 1139  History  By signing my name below, I, Deborah Tran, attest that this documentation has been prepared under the direction and in the presence of Melburn HakeNicole Nyilah Kight, [place]-C. Electronically Signed: Bethel BornBritney Tran, ED Scribe. 09/15/2015. 3:23 PM. Chief Complaint  Patient presents with  . Sore Throat    HPI The history is provided by the patient. No language interpreter was used.   Deborah BonitoKristina Tran is a 24 y.o. female who presents to the Emergency Department complaining of new and worsening sore throat with onset 3 days ago. Pt rates the pain 10/10 in severity noting that is worse with talking and swallowing. Pt states that it feels as if her throat is swollen on the right side. Advil provided no relief in pain at home and she last used it last night. Associated symptoms include fever, chills, weakness, and muffled voice. She also complains of some abdominal cramping associated with her menstrual cycle. Pt denies nausea, vomiting, chest pain, wheezing, difficulty breathing, and cough. No known sick contact.    Past Medical History  Diagnosis Date  . Meningitis     History reviewed. No pertinent past surgical history.  No family history on file.  Social History  Substance Use Topics  . Smoking status: Never Smoker   . Smokeless tobacco: None  . Alcohol Use: Yes     Review of Systems  Constitutional: Positive for fever and chills.  HENT: Positive for sore throat. Negative for trouble swallowing.        Muffled voice  Respiratory: Negative for cough, chest tightness and shortness of breath.   Cardiovascular: Negative for chest pain.  Gastrointestinal: Negative for nausea and vomiting.  Neurological: Positive for weakness.    Home Medications   Prior to Admission medications   Medication Sig Start Date End Date Taking? Authorizing Provider  cephALEXin (KEFLEX) 500 MG capsule Take 1 capsule (500 mg total) by mouth 4 (four) times  daily. Patient not taking: Reported on 11/13/2014 05/31/14   [name], PA-C  fluconazole (DIFLUCAN) 150 MG tablet Take 150 mg by mouth daily. 08/16/15   Historical Provider, MD  ibuprofen (ADVIL,MOTRIN) 200 MG tablet Take 400 mg by mouth every 6 (six) hours as needed for moderate pain (menstrual cramps).     Historical Provider, MD  ibuprofen (ADVIL,MOTRIN) 800 MG tablet Take 1 tablet (800 mg total) by mouth 3 (three) times daily. Patient not taking: Reported on 08/21/2015 11/13/14   Elwin MochaBlair Walden, MD  metroNIDAZOLE (FLAGYL) 500 MG tablet Take 500 mg by mouth 2 (two) times daily. 08/20/15   Historical Provider, MD  oxyCODONE-acetaminophen (PERCOCET) 5-325 MG per tablet Take 1 tablet by mouth every 6 (six) hours as needed for moderate pain. Patient not taking: Reported on 08/21/2015 05/25/14   Purvis SheffieldForrest Harrison, MD  oxyCODONE-acetaminophen (PERCOCET/ROXICET) 5-325 MG per tablet Take 1-2 tablets by mouth every 4 (four) hours as needed for moderate pain or severe pain. Patient not taking: Reported on 11/13/2014 05/31/14   [name], PA-C    Allergies  Apple fruit extract and Hydrocodone  Triage Vitals: BP 120/69 mmHg  Pulse 114  Temp(Src) 100.2 F (37.9 C) (Oral)  Resp 16  SpO2 98%  LMP 09/14/2015  Physical Exam  Constitutional: She is oriented to person, place, and time. She appears well-developed and well-nourished.  HENT:  Head: Normocephalic.  Mouth/Throat: Uvula is midline and mucous membranes are normal. No oral lesions. No trismus in the jaw. No uvula swelling. Posterior oropharyngeal erythema  present. No oropharyngeal exudate, posterior oropharyngeal edema or tonsillar abscesses.  Mild swelling and erythema noted to right tonsil.  Eyes: Conjunctivae and EOM are normal. Pupils are equal, round, and reactive to light. Right eye exhibits no discharge. Left eye exhibits no discharge. No scleral icterus.  Neck: Normal range of motion. Neck supple.  Cardiovascular:  tachycardic   Pulmonary/Chest: Effort normal. No respiratory distress.  Abdominal: She exhibits no distension.  Musculoskeletal: Normal range of motion.  Lymphadenopathy:    She has cervical adenopathy (right submandibular TTP).  Neurological: She is alert and oriented to person, place, and time.  Skin: Skin is warm and dry.  Psychiatric: She has a normal mood and affect.  Nursing note and vitals reviewed.   ED Course  Procedures  DIAGNOSTIC STUDIES: Oxygen Saturation is 98% on RA,  normal by my interpretation.    COORDINATION OF CARE: 12:15 PM Discussed treatment plan which includes lab work, CT scan, and Tylenol with pt at bedside and pt agreed to the plan.  Labs Review-  Labs Reviewed  RAPID STREP SCREEN (NOT AT [facility])  CULTURE, GROUP A STREP  BASIC METABOLIC PANEL    Imaging Review Ct Soft Tissue Neck W Contrast  09/15/2015  CLINICAL DATA:  Sore throat, weakness, and chills. Fever. Right-sided neck pain. EXAM: CT NECK WITH CONTRAST TECHNIQUE: Multidetector CT imaging of the neck was performed using the standard protocol following the bolus administration of intravenous contrast. COMPARISON:  None. FINDINGS: Pharynx and larynx: There is prominence of the tonsils bilaterally, right greater than left. There is no peritonsillar abscess. No prevertebral soft tissue swelling. The epiglottis and larynx appear normal. Prominence of the adenoids. Salivary glands: Normal. Thyroid: Normal. Lymph nodes: Multiple slightly prominent lymph nodes deep to the sternocleidomastoid muscles bilaterally. These are most likely benign reactive nodes. Vascular: Normal. Limited intracranial: Normal. Visualized orbits: Normal. Mastoids and visualized paranasal sinuses: Normal. Skeleton: Normal. Upper chest: Normal. IMPRESSION: 1. Tonsillitis with bilateral tonsil swelling, right more than left, without a peritonsillar abscess. 2. No prevertebral soft tissue abscess.  Prominence of the adenoids. Electronically Signed   By:  Francene Boyers M.D.   On: 09/15/2015 15:12    MDM   Final diagnoses:  Tonsillitis   Patient presents with sore throat, weakness, chills, fever. Endorses pain to her right throat that is worse with talking and swallowing. Pt tachycardic, temp 100.2. Exam reveals mild swelling and erythema noted to right peritonsillar region, muffled voice, tender right submandibular lymphadenopathy. No trismus, drooling, neck swelling or stridor on exam. Patient given PO ibuprofen. Strep negative, culture pending. CT neck ordered to evaluate for peritonsillar abscess versus cellulitis. CT revealed tonsillitis with bilateral tonsil swelling without peritonsillar abscess. Patient given IM penicillin in the ED to tx for ppx GBS. Plan to discharge patient home. Patient given resource guide to follow up with primary care provider.  Evaluation does not show pathology requring ongoing emergent intervention or admission. Pt is hemodynamically stable and mentating appropriately. Discussed findings/results and plan with patient/guardian, who agrees with plan. All questions answered. Return precautions discussed and outpatient follow up given.    I personally performed the services described in this documentation, which was scribed in my presence. The recorded information has been reviewed and is accurate.      Satira Sark [place], PA-C 09/15/15 1524  Samuel Jester, DO 09/19/15 [phone]

## 2015-09-15 NOTE — Discharge Instructions (Signed)
Continue drinking lots of fluids and staying hydrated at home. You may use ibuprofen as prescribed over-the-counter for pain relief. Follow-up with a primary care provider listed in the resource guide provided below in the next 4-5 days. Return to the emergency department if symptoms worsen or new onset of fever, difficulty breathing, difficulty opening eared jaw completely, drooling, neck swelling.    Emergency Department Resource Guide 1) Find a Doctor and Pay Out of Pocket Although you won't have to find out who is covered by your insurance plan, it is a good idea to ask around and get recommendations. You will then need to call the office and see if the doctor you have chosen will accept you as a new patient and what types of options they offer for patients who are self-pay. Some doctors offer discounts or will set up payment plans for their patients who do not have insurance, but you will need to ask so you aren't surprised when you get to your appointment.  2) Contact Your Local Health Department Not all health departments have doctors that can see patients for sick visits, but many do, so it is worth a call to see if yours does. If you don't know where your local health department is, you can check in your phone book. The CDC also has a tool to help you locate your state's health department, and many state websites also have listings of all of their local health departments.  3) Find a Walk-in Clinic If your illness is not likely to be very severe or complicated, you may want to try a walk in clinic. These are popping up all over the country in pharmacies, drugstores, and shopping centers. They're usually staffed by nurse practitioners or physician assistants that have been trained to treat common illnesses and complaints. They're usually fairly quick and inexpensive. However, if you have serious medical issues or chronic medical problems, these are probably not your best option.  No Primary  Care Doctor: - Call Health Connect at  [phone] - they can help you locate a primary care doctor that  accepts your insurance, provides certain services, etc. - Physician Referral Service- [phone]  Chronic Pain Problems: Organization         Address  Phone   Notes  [facility]  [phone]) [phone] Patients need to be referred by their primary care doctor.   Medication Assistance: Organization         Address  Phone   Notes  [facility] [address] [place], [place] [address] [phone]) [phone] --Must be a resident of [facility] [place] -- Must have NO insurance coverage whatsoever (no Medicaid/ Medicare, etc.) -- The pt. MUST have a primary care doctor that directs their care regularly and follows them in the community   Med[facility]ist  [phone]) [phone]   [employer]  [phone]    Agencies that provide inexpensive medical care: Organization         Address  Phone   Notes  [facility]  [phone]   [facility]    [phone]   [facility] [address] [place], [place] [address] [phone]   [facility] [address], [place] [phone]) [phone]   Planned Parenthood    [phone](336) [phone]   [facility]    [phone]) [phone]   [facility] and [facility]  [address], [place] Phone:  [phone], Fax:  [phone] Hours of Operation:  9 am - 6 pm, M-F.  Also accepts Medicaid/Medicare and self-pay.  [facility] for [address], [address], [place] Phone: [phone], Fax: [phone]. Hours of Operation:  8:30 am - 5:30 pm, M-F.  Also accepts Medicaid and self-pay.  [facility] [place] [address], [place] Phone: [phone]   [place], [place], [place] [phone], Ext. [phone] Mondays & Thursdays: 7-9 AM.  First 15 patients are seen on a first  come, first serve basis.    [place] Providers:  Organization         Address  Phone   Notes  [facility] [address], [place] [phone] Also accepts self-pay patients.  [facility] [id number] [place], [place]  [phone]   [place], [address], [place] [phone]   [facility] [address], [place] [phone]   Lucianne Lei [address], [place]   [phone] Only accepts [place] Access [place] patients after they have their name applied to their card.   Self-Pay (no insurance) in [facility]:  Organization         Address  Phone   Notes  Sickle Cell Patients, [facility] [place] [phone]   [facility] [place] [phone]   [facility] [place]  [place], [place], [place] [phone]   [facility]/Dr. Osei-Bonsu  [address], [place] or [address], [place] [phone] Phone number for both [place] and [place] locations is the same.  [facility] [address] [place] [phone]   [facility] [address] [place] or [address] [phone]   [facility] [address] [place] [phone], phone; [phone], fax Sees patients 1st and 3rd Saturday of every month.  Must not qualify for public or private insurance (i.e. Medicaid, Medicare, [place] Health Choice, Veterans' Benefits)  Household income should be no more than 200% of the poverty level The clinic cannot treat you if you are pregnant or think you are pregnant  Sexually transmitted diseases are not treated at the clinic.    Dental Care: Organization          Address  Phone  Notes  [facility] [phone] Accepts children up to age 79 who are enrolled in [place] or [place]; pregnant women with a Medicaid card; and children who have applied for Medicaid or [place] Health Choice, but were declined, whose parents can pay a reduced fee at time of service.  [facility]  [address], [place] [phone] Accepts children up to age 29 who are enrolled in [place] or [place]; pregnant women with a Medicaid card; and children who have applied for Medicaid or [place] Health Choice, but were declined, whose parents can pay a reduced fee at time of service.  [facility]  [place] [phone] Patients are seen by appointment only. Walk-ins  are not accepted. [place] will see patients 83 years of age and older. Monday - Tuesday (8am-5pm) Most Wednesdays (8:30-5pm) $30 per visit, cash only  [facility] Access PRO[facility]  [address], [facility] [phone] Patients are seen by appointment only. Walk-ins are not accepted. [place] will see patients 74 years of age and older. One Wednesday Evening (Monthly: Volunteer Based).  $30 per visit, cash only  [place]  [phone] for adults; Children under age 26, call Graduate Pediatric Dentistry at [phone]. Children aged 68-14, please call [phone] to request a pediatric application.  Dental services are provided in all areas of dental care including fillings, crowns and bridges, complete and partial dentures, implants, gum treatment, root canals, and extractions. Preventive care is also provided. Treatment is provided to both adults and children. Patients are selected via a lottery and there is often a waiting list.   [facility] [address] [place]  [phone] [email or web address]   [facility] [address] [place], [place] [phone], Ext. [phone] Second and Fourth Thursday of each month, opens at 6:30 AM; Clinic ends at 9 AM.  Patients are seen on a first-come first-served basis, and a limited number are seen during each clinic.   [facility]  [address] Hillard Danker [place], [place] [phone]   Eligibility Requirements You must have lived in [place], [place], or [place] counties for at least the last three months.   You cannot be eligible for state or federal sponsored [employer], including [employer], [place], or [employer].   You generally cannot be eligible for healthcare insurance through your employer.    How to apply: Eligibility screenings are held every Tuesday and Wednesday afternoon from 1:00 pm until 4:00 pm. You do not need an appointment for the interview!  [facility] [address] [place], [place]   [place]  [place] Department  [place]  [phone]    Behavioral Health Resources in the Community: Intensive Outpatient Programs Organization         Address  Phone  Notes  [place]. [address] [place], [place] [phone]   [facility] [address] [place], [place]   ADS: [facility] [address] [place], [place]   [place] [address],  [place], [place] or [phone]   Substance Abuse Resources Organization         Address  Phone  Notes  Alcohol and Drug Services  [phone]   [place]  [phone]   [facility] [place]   Chinita Pester  [phone]   Residential & Outpatient Substance Abuse Program  [phone]   Psychological  Services Organization         Address  Phone  Notes  [facility] [place]  [place]  [phone]   [place] [address] [place] [phone] or [phone]    Mobile Crisis Teams Organization         Address  Phone  Notes  [facility], Mobile Crisis Care Unit  [phone]   [facility]  [address] [place], [place]   [facility] [address] [place] [phone]  Self-Help/Support Groups Organization         Address  Phone             Notes  [facility] [place] - variety of support groups  [phone]- [phone] Call for more information  Narcotics Anonymous (NA), [facility] [address], [place]  2 meetings at this location   Statisticianesidential Treatment Programs Organization         Address  Phone  Notes  [facility] [address],    [place]  [phone]   [facility]  [address], [place] [phone], [place], [place] [phone]   [facility] [address], [place] [phone] Admissions: 8am-3pm M-F  [facility] [address]    [place], [place] [phone]   [facility] [address], [place], [place] [phone]   [facility] [address]  [place], [place] [phone]   [facility] - Intensive Outpatient [address], [place], [place] [phone]   [facility] ([facility]iction Recovery [facility] [address]  [place], [place] [phone] or [phone]   [facility] (RTS) [address] [place], [place] [phone] Accepts Medicaid  [facility] [address]  [place] [phone] Substance Abuse/Addiction Treatment   [facility] Behavioral Health Resources Organization         Address  Phone  Notes  [facility]  [phone])  [phone]   Angie FavaJulie Brannon, PhD [address], [address] [place], [place]   [phone] or [phone](336) [phone]   [facility]   [address] [place], [place] [phone]) [phone]   [facility] [address], [place], [place] [phone](336) [phone] Insurance/Medicaid/sponsorship through [facility]  [facility] Families [address]                                    [place], [place] [phone](336) [phone] Therapy/tele-psych/case  [facility] [address].   [place], [place] [phone](336) [phone]    Dr. Lolly MustacheArfeen  [phone]) [phone]   [facility]  [employer] Way [facility] 1) [address], [place] 2) [address], [place] 3)  [address], [place] [phone]) [phone]) [phone]   [facility] Child Abuse Hotline [phone]) [phone] or [phone](336) [phone] (After Hours)

## 2015-09-15 NOTE — ED Notes (Signed)
Patient with sore throat, weakness, chills since Thursday.  Patient running a fever.

## 2015-09-17 ENCOUNTER — Emergency Department (HOSPITAL_COMMUNITY)
Admission: EM | Admit: 2015-09-17 | Discharge: 2015-09-17 | Disposition: A | Discharge status: Home / Self Care | Payer: BLUE CROSS/BLUE SHIELD | Attending: Emergency Medicine | Admitting: Emergency Medicine

## 2015-09-17 ENCOUNTER — Encounter (HOSPITAL_COMMUNITY): Payer: Self-pay | Admitting: *Deleted

## 2015-09-17 DIAGNOSIS — Z8661 Personal history of infections of the central nervous system: Secondary | ICD-10-CM | POA: Insufficient documentation

## 2015-09-17 DIAGNOSIS — J029 Acute pharyngitis, unspecified: Secondary | ICD-10-CM

## 2015-09-17 DIAGNOSIS — J039 Acute tonsillitis, unspecified: Secondary | ICD-10-CM | POA: Diagnosis not present

## 2015-09-17 LAB — CULTURE, GROUP A STREP: Strep A Culture: NEGATIVE

## 2015-09-17 MED ORDER — NAPROXEN 250 MG PO TABS: 250.0000 mg | ORAL_TABLET | Freq: Two times a day (BID) | ORAL | Status: DC

## 2015-09-17 MED ORDER — ACETAMINOPHEN 325 MG PO TABS
650.0000 mg | ORAL_TABLET | Freq: Once | ORAL | Status: AC
  Administered 2015-09-17: 650 mg via ORAL
  Filled 2015-09-17: qty 2

## 2015-09-17 MED ORDER — DEXAMETHASONE SODIUM PHOSPHATE 10 MG/ML IJ SOLN
10.0000 mg | Freq: Once | INTRAMUSCULAR | Status: AC
  Administered 2015-09-17: 10 mg via INTRAMUSCULAR
  Filled 2015-09-17: qty 1

## 2015-09-17 NOTE — Discharge Instructions (Signed)
Tonsillitis °Tonsillitis is an infection of the throat that causes the tonsils to become red, tender, and swollen. Tonsils are collections of lymphoid tissue at the back of the throat. Each tonsil has crevices (crypts). Tonsils help fight nose and throat infections and keep infection from spreading to other parts of the body for the first 18 months of life.  °CAUSES °Sudden (acute) tonsillitis is usually caused by infection with streptococcal bacteria. Long-lasting (chronic) tonsillitis occurs when the crypts of the tonsils become filled with pieces of food and bacteria, which makes it easy for the tonsils to become repeatedly infected. °SYMPTOMS  °Symptoms of tonsillitis include: °· A sore throat, with possible difficulty swallowing. °· White patches on the tonsils. °· Fever. °· Tiredness. °· New episodes of snoring during sleep, when you did not snore before. °· Small, foul-smelling, yellowish-white pieces of material (tonsilloliths) that you occasionally cough up or spit out. The tonsilloliths can also cause you to have bad breath. °DIAGNOSIS °Tonsillitis can be diagnosed through a physical exam. Diagnosis can be confirmed with the results of lab tests, including a throat culture. °TREATMENT  °The goals of tonsillitis treatment include the reduction of the severity and duration of symptoms and prevention of associated conditions. Symptoms of tonsillitis can be improved with the use of steroids to reduce the swelling. Tonsillitis caused by bacteria can be treated with antibiotic medicines. Usually, treatment with antibiotic medicines is started before the cause of the tonsillitis is known. However, if it is determined that the cause is not bacterial, antibiotic medicines will not treat the tonsillitis. If attacks of tonsillitis are severe and frequent, your health care provider may recommend surgery to remove the tonsils (tonsillectomy). °HOME CARE INSTRUCTIONS  °· Rest as much as possible and get plenty of  sleep. °· Drink plenty of fluids. While the throat is very sore, eat soft foods or liquids, such as sherbet, soups, or instant breakfast drinks. °· Eat frozen ice pops. °· Gargle with a warm or cold liquid to help soothe the throat. Mix 1/4 teaspoon of salt and 1/4 teaspoon of baking soda in 8 oz of water. °SEEK MEDICAL CARE IF:  °· Large, tender lumps develop in your neck. °· A rash develops. °· A green, yellow-brown, or bloody substance is coughed up. °· You are unable to swallow liquids or food for 24 hours. °· You notice that only one of the tonsils is swollen. °SEEK IMMEDIATE MEDICAL CARE IF:  °· You develop any new symptoms such as vomiting, severe headache, stiff neck, chest pain, or trouble breathing or swallowing. °· You have severe throat pain along with drooling or voice changes. °· You have severe pain, unrelieved with recommended medications. °· You are unable to fully open the mouth. °· You develop redness, swelling, or severe pain anywhere in the neck. °· You have a fever. °MAKE SURE YOU:  °· Understand these instructions. °· Will watch your condition. °· Will get help right away if you are not doing well or get worse. °  °This information is not intended to replace advice given to you by your health care provider. Make sure you discuss any questions you have with your health care provider. °  °Document Released: 08/13/2005 Document Revised: 11/24/2014 Document Reviewed: 04/22/2013 °Elsevier Interactive Patient Education ©2016 Elsevier Inc. ° °Pharyngitis °Pharyngitis is redness, pain, and swelling (inflammation) of your pharynx.  °CAUSES  °Pharyngitis is usually caused by infection. Most of the time, these infections are from viruses (viral) and are part of a cold. However,   sometimes pharyngitis is caused by bacteria (bacterial). Pharyngitis can also be caused by allergies. Viral pharyngitis may be spread from person to person by coughing, sneezing, and personal items or utensils (cups, forks,  spoons, toothbrushes). Bacterial pharyngitis may be spread from person to person by more intimate contact, such as kissing.  SIGNS AND SYMPTOMS  Symptoms of pharyngitis include:   Sore throat.   Tiredness (fatigue).   Low-grade fever.   Headache.  Joint pain and muscle aches.  Skin rashes.  Swollen lymph nodes.  Plaque-like film on throat or tonsils (often seen with bacterial pharyngitis). DIAGNOSIS  Your health care provider will ask you questions about your illness and your symptoms. Your medical history, along with a physical exam, is often all that is needed to diagnose pharyngitis. Sometimes, a rapid strep test is done. Other lab tests may also be done, depending on the suspected cause.  TREATMENT  Viral pharyngitis will usually get better in 3-4 days without the use of medicine. Bacterial pharyngitis is treated with medicines that kill germs (antibiotics).  HOME CARE INSTRUCTIONS   Drink enough water and fluids to keep your urine clear or pale yellow.   Only take over-the-counter or prescription medicines as directed by your health care provider:   If you are prescribed antibiotics, make sure you finish them even if you start to feel better.   Do not take aspirin.   Get lots of rest.   Gargle with 8 oz of salt water ( tsp of salt per 1 qt of water) as often as every 1-2 hours to soothe your throat.   Throat lozenges (if you are not at risk for choking) or sprays may be used to soothe your throat. SEEK MEDICAL CARE IF:   You have large, tender lumps in your neck.  You have a rash.  You cough up green, yellow-brown, or bloody spit. SEEK IMMEDIATE MEDICAL CARE IF:   Your neck becomes stiff.  You drool or are unable to swallow liquids.  You vomit or are unable to keep medicines or liquids down.  You have severe pain that does not go away with the use of recommended medicines.  You have trouble breathing (not caused by a stuffy nose). MAKE SURE YOU:    Understand these instructions.  Will watch your condition.  Will get help right away if you are not doing well or get worse.   This information is not intended to replace advice given to you by your health care provider. Make sure you discuss any questions you have with your health care provider.   Document Released: 11/03/2005 Document Revised: 08/24/2013 Document Reviewed: 07/11/2013 Elsevier Interactive Patient Education [employer] Elsevier Inc.

## 2015-09-17 NOTE — ED Provider Notes (Signed)
  Physical Exam  BP 113/81 mmHg  Pulse 93  Temp(Src) 98.1 F (36.7 C) (Oral)  Resp 18  SpO2 100%  LMP 09/14/2015  Physical Exam  ED Course  Procedures S: Mrs. Deborah Tran is a 24 year old female who presents for throat swelling and change in voice that occurred 2 days ago. She reports difficulty swallowing. She was seen in the ED 2 days ago and had a CT of her neck which was negative for tonsillar abscess. She also had a negative strep test at that time.  O: Filed Vitals:   09/17/15 1153  BP: 113/81  Pulse: 93  Temp: 98.1 F (36.7 C)  Resp: 18    Throat: Bilateral tonsillar swelling with exudates, edema, and erythema. Uvula is midline but swollen. No kissing tonsils. No airway compromise. Muffled voice. No cervical anterior lymphadenopathy or swelling.   A & P: CT neck from 2 days ago is negative for tonsillar abscess. Strep from 2 days ago was also negative. Patient would likely benefit from Decadron. A new provider will see her once transferred to the back.        Catha GosselinHanna Patel-Mills, PA-C 09/17/15 1302  Leta BaptistEmily Roe Nguyen, MD 09/18/15 2202

## 2015-09-17 NOTE — ED Notes (Signed)
Discharge instructions, follow up care, and rx x1 reviewed with patient. Patient verbalized understanding. 

## 2015-09-17 NOTE — ED Provider Notes (Signed)
CSN: [id number]     Arrival date & time 09/17/15  1127 History   First MD Initiated Contact with Patient 09/17/15 1241     Chief Complaint  Patient presents with  . Sore Throat   Deborah Tran is a 24 y.o. female who presents to the emergency department complaining of a sore throat ongoing for the past 5 days. The patient reports she was seen in the emergency department 2 days ago was diagnosed with tonsillitis after CT scan showed no evidence of peritonsillar abscess. Her rapid strep test was negative. Patient did receive penicillin IM but was discharged with no medicines and received no steroids in the emergency department. Patient reports she is having 6 out of 10 throat pain and still has pain with swallowing. Patient reports she has been able to swallow and has been tolerating secretions without difficulty. She reports her fever has resolved. She put she has been able to tolerate liquids but still has lots of pain with eating solid food. The patient denies fevers, neck pain, neck stiffness, rashes, headache, drooling, trouble swallowing, cough, shortness of breath.   (Consider location/radiation/quality/duration/timing/severity/associated sxs/prior Treatment) HPI  Past Medical History  Diagnosis Date  . Meningitis    History reviewed. No pertinent past surgical history. History reviewed. No pertinent family history. Social History  Substance Use Topics  . Smoking status: Never Smoker   . Smokeless tobacco: None  . Alcohol Use: Yes   OB History    No data available     Review of Systems  Constitutional: Negative for fever and chills.  HENT: Positive for sore throat. Negative for congestion and trouble swallowing.   Eyes: Negative for visual disturbance.  Respiratory: Negative for cough, shortness of breath and wheezing.   Cardiovascular: Negative for chest pain.  Gastrointestinal: Negative for nausea, vomiting and abdominal pain.  Genitourinary: Negative for dysuria.   Musculoskeletal: Negative for neck pain and neck stiffness.  Skin: Negative for rash.  Neurological: Negative for headaches.      Allergies  Apple fruit extract and Hydrocodone  Home Medications   Prior to Admission medications   Medication Sig Start Date End Date Taking? Authorizing Provider  ibuprofen (ADVIL,MOTRIN) 200 MG tablet Take 400 mg by mouth every 8 (eight) hours as needed for moderate pain (menstrual cramps).    Yes Historical Provider, MD  naproxen sodium (ANAPROX) 220 MG tablet Take 440 mg by mouth 2 (two) times daily as needed (pain).   Yes Historical Provider, MD  phenol (CHLORASEPTIC) 1.4 % LIQD Use as directed 3 sprays in the mouth or throat as needed for throat irritation / pain.   Yes Historical Provider, MD  naproxen (NAPROSYN) 250 MG tablet Take 1 tablet (250 mg total) by mouth 2 (two) times daily with a meal. 09/17/15   Everlene Farrier, PA-C   BP 114/78 mmHg  Pulse 71  Temp(Src) 98.1 F (36.7 C) (Oral)  Resp 16  SpO2 100%  LMP 09/14/2015 Physical Exam  Constitutional: She is oriented to person, place, and time. She appears well-developed and well-nourished. No distress.  Nontoxic appearing.  HENT:  Head: Normocephalic and atraumatic.  Right Ear: External ear normal.  Left Ear: External ear normal.  Moderate bilateral tonsillar hypertrophy with her right greater than left. Tonsils are not kissing. There is mild edema of her uvula with erythema. No posterior oropharyngeal edema. No peritonsillar abscess. Patient is having secretions without difficulty. No trismus. No drooling. No discharge from the mouth. Bilateral tympanic membranes are pearly-gray without erythema or  loss of landmarks.   Eyes: Conjunctivae are normal. Pupils are equal, round, and reactive to light. Right eye exhibits no discharge. Left eye exhibits no discharge.  Neck: Normal range of motion. Neck supple. No JVD present. No tracheal deviation present.  Good normal range of motion of her  neck. No meningeal signs.  Cardiovascular: Normal rate, regular rhythm, normal heart sounds and intact distal pulses.   Pulmonary/Chest: Effort normal and breath sounds normal. No respiratory distress. She has no wheezes. She has no rales.  Lungs clear to auscultation bilaterally.  Abdominal: Soft. There is no tenderness.  Lymphadenopathy:    She has no cervical adenopathy.  Neurological: She is alert and oriented to person, place, and time. Coordination normal.  Skin: Skin is warm and dry. No rash noted. She is not diaphoretic. No erythema. No pallor.  Psychiatric: She has a normal mood and affect. Her behavior is normal.  Nursing note and vitals reviewed.   ED Course  Procedures (including critical care time) Labs Review Labs Reviewed - No data to display  Imaging Review No results found.    EKG Interpretation None      Filed Vitals:   09/17/15 1153 09/17/15 1431  BP: 113/81 114/78  Pulse: 93 71  Temp: 98.1 F (36.7 C) 98.1 F (36.7 C)  TempSrc: Oral Oral  Resp: 18 16  SpO2: 100% 100%     MDM   Meds given in ED:  Medications  dexamethasone (DECADRON) injection 10 mg (10 mg Intramuscular Given 09/17/15 1158)  acetaminophen (TYLENOL) tablet 650 mg (650 mg Oral Given 09/17/15 1342)    Discharge Medication List as of 09/17/2015  2:39 PM    START taking these medications   Details  naproxen (NAPROSYN) 250 MG tablet Take 1 tablet (250 mg total) by mouth 2 (two) times daily with a meal., Starting 09/17/2015, Until Discontinued, Print        Final diagnoses:  Tonsillitis  Sore throat   This is a 24 y.o. female who presents to the emergency department complaining of a sore throat ongoing for the past 5 days. The patient reports she was seen in the emergency department 2 days ago was diagnosed with tonsillitis after CT scan showed no evidence of peritonsillar abscess. Her rapid strep test was negative. Patient did receive penicillin IM but was discharged with no  medicines and received no steroids in the emergency department. Patient reports she is having 6 out of 10 throat pain and still has pain with swallowing. She reports she's been handling her secretions without difficulty and is no drooling. No peritonsillar abscess. She has had no neck pain or neck stiffness. She reports she's been able to swallow but just has pain with swallowing solid foods. She reports her fevers have resolved. On exam the patient is afebrile and nontoxic appearing. I evaluated the patient in triage where she had bilateral tonsillar hypertrophy and erythema with mildly edematous uvula. Her uvula is also erythematous. She is handling her secretions without difficulty. I provided her with a Decadron injection and patient was moved to an acute care bed. By the time I came around to evaluate the patient she reports she is starting to feel better. She is handling her secretions without difficulty. Will do PO trial with Tylenol and water. Patient tolerated Tylenol and water without difficulty. Prior to discharge the patient's voice has returned back to normal. She reports she is feeling much better. Throat exam has improved. She is handling her secretions without difficulty. No  neck pain or neck stiffness. Will discharge prescriptions for naproxen and have her follow closely with her primary care provider. I advised strict return precautions. I advised the patient to follow-up with their primary care provider this week. I advised the patient to return to the emergency department with new or worsening symptoms or new concerns. The patient verbalized understanding and agreement with plan.    This patient was discussed with Dr. Criss Alvine who agrees with assessment and plan.     Everlene Farrier, PA-C 09/17/15 1542  Pricilla Loveless, MD 09/20/15 [phone]

## 2015-09-17 NOTE — ED Notes (Addendum)
Pt reports she was seen in ED and treated on Saturday 10/29, had CT performed and was given a shot of abx. Pt reports she had hoarse voice on Saturday, pt has no voice at this point. Uvula is extremely swollen, kissing tonsilts, rn asked PA to look at pt. PA states pt needs to go to the back and a steroid shot immediately.

## 2016-11-12 ENCOUNTER — Encounter (HOSPITAL_COMMUNITY): Payer: Self-pay | Admitting: Emergency Medicine

## 2016-11-12 ENCOUNTER — Emergency Department (HOSPITAL_COMMUNITY)
Admission: EM | Admit: 2016-11-12 | Discharge: 2016-11-12 | Disposition: A | Discharge status: Home / Self Care | Payer: PRIVATE HEALTH INSURANCE | Attending: Emergency Medicine | Admitting: Emergency Medicine

## 2016-11-12 DIAGNOSIS — R109 Unspecified abdominal pain: Secondary | ICD-10-CM | POA: Insufficient documentation

## 2016-11-12 DIAGNOSIS — Z79899 Other long term (current) drug therapy: Secondary | ICD-10-CM | POA: Insufficient documentation

## 2016-11-12 LAB — CBC WITH DIFFERENTIAL/PLATELET
Basophils Absolute: 0 10*3/uL (ref 0.0–0.1)
Basophils Relative: 0 %
EOS ABS: 0 10*3/uL (ref 0.0–0.7)
Eosinophils Relative: 0 %
HEMATOCRIT: 40.7 % (ref 36.0–46.0)
HEMOGLOBIN: 14.2 g/dL (ref 12.0–15.0)
LYMPHS ABS: 0.6 10*3/uL — AB (ref 0.7–4.0)
LYMPHS PCT: 7 %
MCH: 31 pg (ref 26.0–34.0)
MCHC: 34.9 g/dL (ref 30.0–36.0)
MCV: 88.9 fL (ref 78.0–100.0)
MONOS PCT: 6 %
Monocytes Absolute: 0.5 10*3/uL (ref 0.1–1.0)
NEUTROS ABS: 7.5 10*3/uL (ref 1.7–7.7)
NEUTROS PCT: 87 %
Platelets: 231 10*3/uL (ref 150–400)
RBC: 4.58 MIL/uL (ref 3.87–5.11)
RDW: 13 % (ref 11.5–15.5)
WBC: 8.7 10*3/uL (ref 4.0–10.5)

## 2016-11-12 LAB — COMPREHENSIVE METABOLIC PANEL
ALK PHOS: 54 U/L (ref 38–126)
ALT: 14 U/L (ref 14–54)
ANION GAP: 11 (ref 5–15)
AST: 19 U/L (ref 15–41)
Albumin: 5.1 g/dL — ABNORMAL HIGH (ref 3.5–5.0)
BILIRUBIN TOTAL: 1.2 mg/dL (ref 0.3–1.2)
BUN: 14 mg/dL (ref 6–20)
CALCIUM: 9.7 mg/dL (ref 8.9–10.3)
CO2: 26 mmol/L (ref 22–32)
Chloride: 101 mmol/L (ref 101–111)
Creatinine, Ser: 0.68 mg/dL (ref 0.44–1.00)
GLUCOSE: 105 mg/dL — AB (ref 65–99)
Potassium: 3.6 mmol/L (ref 3.5–5.1)
Sodium: 138 mmol/L (ref 135–145)
TOTAL PROTEIN: 8.8 g/dL — AB (ref 6.5–8.1)

## 2016-11-12 LAB — CBC
HCT: 40.8 % (ref 36.0–46.0)
HEMOGLOBIN: 13.8 g/dL (ref 12.0–15.0)
MCH: 30.4 pg (ref 26.0–34.0)
MCHC: 33.8 g/dL (ref 30.0–36.0)
MCV: 89.9 fL (ref 78.0–100.0)
Platelets: 249 10*3/uL (ref 150–400)
RBC: 4.54 MIL/uL (ref 3.87–5.11)
RDW: 12.9 % (ref 11.5–15.5)
WBC: 6.7 10*3/uL (ref 4.0–10.5)

## 2016-11-12 LAB — URINALYSIS, ROUTINE W REFLEX MICROSCOPIC
Bilirubin Urine: NEGATIVE
GLUCOSE, UA: NEGATIVE mg/dL
Hgb urine dipstick: NEGATIVE
KETONES UR: 80 mg/dL — AB
Leukocytes, UA: NEGATIVE
NITRITE: NEGATIVE
PH: 5 (ref 5.0–8.0)
Protein, ur: 30 mg/dL — AB
Specific Gravity, Urine: 1.034 — ABNORMAL HIGH (ref 1.005–1.030)

## 2016-11-12 LAB — LIPASE, BLOOD: Lipase: 25 U/L (ref 11–51)

## 2016-11-12 LAB — POC URINE PREG, ED: Preg Test, Ur: NEGATIVE

## 2016-11-12 MED ORDER — KETOROLAC TROMETHAMINE 30 MG/ML IJ SOLN
15.0000 mg | Freq: Once | INTRAMUSCULAR | Status: AC
  Administered 2016-11-12: 15 mg via INTRAVENOUS
  Filled 2016-11-12: qty 1

## 2016-11-12 MED ORDER — SODIUM CHLORIDE 0.9 % IV BOLUS (SEPSIS)
1000.0000 mL | Freq: Once | INTRAVENOUS | Status: AC
  Administered 2016-11-12: 1000 mL via INTRAVENOUS

## 2016-11-12 MED ORDER — FENTANYL CITRATE (PF) 100 MCG/2ML IJ SOLN
50.0000 ug | Freq: Once | INTRAMUSCULAR | Status: AC
  Administered 2016-11-12: 50 ug via INTRAVENOUS
  Filled 2016-11-12: qty 2

## 2016-11-12 MED ORDER — ONDANSETRON 4 MG PO TBDP: 4.0000 mg | ORAL_TABLET | Freq: Three times a day (TID) | ORAL | 0 refills | Status: DC | PRN

## 2016-11-12 MED ORDER — ONDANSETRON 4 MG PO TBDP
4.0000 mg | ORAL_TABLET | Freq: Once | ORAL | Status: AC | PRN
  Administered 2016-11-12: 4 mg via ORAL
  Filled 2016-11-12: qty 1

## 2016-11-12 MED ORDER — SODIUM CHLORIDE 0.9 % IV BOLUS (SEPSIS): 1000.0000 mL | Freq: Once | INTRAVENOUS | Status: DC

## 2016-11-12 MED ORDER — SODIUM CHLORIDE 0.9 % IV SOLN
INTRAVENOUS | Status: DC
  Administered 2016-11-12: 1000 mL via INTRAVENOUS

## 2016-11-12 MED ORDER — ONDANSETRON HCL 4 MG/2ML IJ SOLN
4.0000 mg | Freq: Once | INTRAMUSCULAR | Status: AC
  Administered 2016-11-12: 4 mg via INTRAVENOUS
  Filled 2016-11-12: qty 2

## 2016-11-12 NOTE — ED Notes (Signed)
Per Dr. Jeraldine LootsLockwood, pt may have ice chips.  Ice chips provided.

## 2016-11-12 NOTE — ED Notes (Signed)
Pt requesting ice chips or water.  Informed will speak to EDP.

## 2016-11-12 NOTE — ED Notes (Signed)
Patient made aware urine sample is needed. Patient states she cannot void at this time. Patient encouraged to void when able. 

## 2016-11-12 NOTE — ED Provider Notes (Signed)
[facility]-EMERGENCY DEPT Provider Note   CSN: [id number] Arrival date & time: 11/12/16  1344     History   Chief Complaint Chief Complaint  Patient presents with  . Emesis  . Abdominal Pain    HPI Deborah Tran is a 25 y.o. female.  HPI Patient presents with concern of abdominal pain, back pain, nausea, vomiting, weakness. Patient was well until awakening with symptoms today, about 12 hours ago. Pain is focally in the back, lower, mid back, worse on the right. Patient has diminished urine output, has had multiple episodes of nausea, vomiting. No clear alleviating or exacerbating factors, no fever. No confusion, no disorientation, no chest pain.  Patient denies history of renal issues.  Past Medical History:  Diagnosis Date  . Meningitis     There are no active problems to display for this patient.   History reviewed. No pertinent surgical history.  OB History    No data available       Home Medications    Prior to Admission medications   Medication Sig Start Date End Date Taking? Authorizing Provider  ibuprofen (ADVIL,MOTRIN) 200 MG tablet Take 400 mg by mouth every 8 (eight) hours as needed for moderate pain (menstrual cramps).     Historical Provider, MD  naproxen (NAPROSYN) 250 MG tablet Take 1 tablet (250 mg total) by mouth 2 (two) times daily with a meal. Patient not taking: Reported on 11/12/2016 09/17/15   Everlene FarrierWilliam Dansie, PA-C  naproxen sodium (ANAPROX) 220 MG tablet Take 440 mg by mouth 2 (two) times daily as needed (pain).    Historical Provider, MD  phenol (CHLORASEPTIC) 1.4 % LIQD Use as directed 3 sprays in the mouth or throat as needed for throat irritation / pain.    Historical Provider, MD    Family History History reviewed. No pertinent family history.  Social History Social History  Substance Use Topics  . Smoking status: Never Smoker  . Smokeless tobacco: Never Used  . Alcohol use Yes     Allergies   Apple fruit extract and  Hydrocodone   Review of Systems Review of Systems  Constitutional:       Per HPI, otherwise negative  HENT:       Per HPI, otherwise negative  Respiratory:       Per HPI, otherwise negative  Cardiovascular:       Per HPI, otherwise negative  Gastrointestinal: Positive for nausea and vomiting.  Endocrine:       Negative aside from HPI  Genitourinary:       Neg aside from HPI   Musculoskeletal:       Per HPI, otherwise negative  Skin: Negative.   Neurological: Negative for syncope.     Physical Exam Updated Vital Signs BP 116/76 (BP Location: Left Arm)   Pulse 95   Temp 98.2 F (36.8 C)   Resp 16   Wt 119 lb (54 kg)   LMP 10/16/2016   SpO2 100%   BMI 21.08 kg/m   Physical Exam  Constitutional: She is oriented to person, place, and time. She appears well-developed and well-nourished. No distress.  HENT:  Head: Normocephalic and atraumatic.  Eyes: Conjunctivae and EOM are normal.  Cardiovascular: Normal rate and regular rhythm.   Pulmonary/Chest: Effort normal and breath sounds normal. No stridor. No respiratory distress.  Abdominal: She exhibits no distension. There is tenderness.  Minimal tenderness to palpation about the right lateral abdomen, and bilateral flanks  Musculoskeletal: She exhibits no edema.  Neurological: She  is alert and oriented to person, place, and time. No cranial nerve deficit.  Skin: Skin is warm and dry.  Psychiatric: She has a normal mood and affect.  Nursing note and vitals reviewed.    ED Treatments / Results  Labs (all labs ordered are listed, but only abnormal results are displayed) Labs Reviewed  COMPREHENSIVE METABOLIC PANEL - Abnormal; Notable for the following:       Result Value   Glucose, Bld 105 (*)    Total Protein 8.8 (*)    Albumin 5.1 (*)    All other components within normal limits  URINALYSIS, ROUTINE W REFLEX MICROSCOPIC - Abnormal; Notable for the following:    APPearance HAZY (*)    Specific Gravity, Urine  1.034 (*)    Ketones, ur 80 (*)    Protein, ur 30 (*)    Bacteria, UA RARE (*)    Squamous Epithelial / LPF 0-5 (*)    All other components within normal limits  CBC WITH DIFFERENTIAL/PLATELET - Abnormal; Notable for the following:    Lymphs Abs 0.6 (*)    All other components within normal limits  LIPASE, BLOOD  CBC  POC URINE PREG, ED  POC URINE PREG, ED     Procedures (including critical care time)  Medications Ordered in ED Medications  sodium chloride 0.9 % bolus 1,000 mL (not administered)    And  0.9 %  sodium chloride infusion (not administered)  ketorolac (TORADOL) 30 MG/ML injection 15 mg (not administered)  ondansetron (ZOFRAN) injection 4 mg (not administered)  fentaNYL (SUBLIMAZE) injection 50 mcg (not administered)  ondansetron (ZOFRAN-ODT) disintegrating tablet 4 mg (4 mg Oral Given 11/12/16 1357)     Initial Impression / Assessment and Plan / ED Course  I have reviewed the triage vital signs and the nursing notes.  Pertinent labs & imaging results that were available during my care of the patient were reviewed by me and considered in my medical decision making (see chart for details).  Clinical Course   11:01 PM Patient appears better, seems comfortable, states that she feels generally better. We discussed all findings, concern for dehydration, and largely reassuring labs. With no evidence for ongoing infection, kidney stone, urine culture sent, but with improvement in her condition, patient appropriate for discharge with close outpatient follow-up. Patient made aware of need to replenish fluids, get rest, monitor her condition carefully, and return here for concerning changes.  Final Clinical Impressions(s) / ED Diagnoses  Flank pain   Gerhard Munchobert Saavi Mceachron, MD 11/12/16 2302

## 2016-11-12 NOTE — ED Triage Notes (Signed)
Patient reports generalized abdominal pain, nausea, and 5 episodes of emesis since this morning. Denies diarrhea.

## 2016-11-12 NOTE — ED Notes (Signed)
Pt provided Coke & informed Dr. Jeraldine LootsLockwood to be in soon.  Pt verbalized understanding.

## 2016-11-12 NOTE — Discharge Instructions (Signed)
As discussed, your evaluation today has been largely reassuring.  But, it is important that you monitor your condition carefully, and do not hesitate to return to the ED if you develop new, or concerning changes in your condition. ? ?Otherwise, please follow-up with your physician for appropriate ongoing care. ? ?

## 2016-11-12 NOTE — ED Notes (Signed)
Pt placed on continuous pulse ox

## 2016-11-14 LAB — URINE CULTURE: Culture: NO GROWTH

## 2017-05-16 ENCOUNTER — Encounter: Payer: Self-pay | Admitting: Emergency Medicine

## 2017-05-16 ENCOUNTER — Emergency Department (HOSPITAL_COMMUNITY): Payer: Managed Care, Other (non HMO)

## 2017-05-16 ENCOUNTER — Emergency Department (HOSPITAL_COMMUNITY)
Admission: EM | Admit: 2017-05-16 | Discharge: 2017-05-17 | Disposition: A | Discharge status: Home / Self Care | Payer: Managed Care, Other (non HMO) | Attending: Emergency Medicine | Admitting: Emergency Medicine

## 2017-05-16 DIAGNOSIS — O219 Vomiting of pregnancy, unspecified: Secondary | ICD-10-CM | POA: Insufficient documentation

## 2017-05-16 LAB — URINALYSIS, ROUTINE W REFLEX MICROSCOPIC
Bilirubin Urine: NEGATIVE
Glucose, UA: NEGATIVE mg/dL
Hgb urine dipstick: NEGATIVE
KETONES UR: 80 mg/dL — AB
Leukocytes, UA: NEGATIVE
Nitrite: NEGATIVE
PROTEIN: 30 mg/dL — AB
Specific Gravity, Urine: 1.03 (ref 1.005–1.030)
pH: 6 (ref 5.0–8.0)

## 2017-05-16 LAB — COMPREHENSIVE METABOLIC PANEL
ALBUMIN: 4.2 g/dL (ref 3.5–5.0)
ALT: 18 U/L (ref 14–54)
ANION GAP: 9 (ref 5–15)
AST: 32 U/L (ref 15–41)
Alkaline Phosphatase: 36 U/L — ABNORMAL LOW (ref 38–126)
BILIRUBIN TOTAL: 1.3 mg/dL — AB (ref 0.3–1.2)
BUN: 6 mg/dL (ref 6–20)
CHLORIDE: 100 mmol/L — AB (ref 101–111)
CO2: 23 mmol/L (ref 22–32)
Calcium: 9.5 mg/dL (ref 8.9–10.3)
Creatinine, Ser: 0.72 mg/dL (ref 0.44–1.00)
GFR calc Af Amer: >60 mL/min (ref >60)
GFR calc non Af Amer: >60 mL/min (ref >60)
GLUCOSE: 73 mg/dL (ref 65–99)
Potassium: 4.8 mmol/L (ref 3.5–5.1)
SODIUM: 132 mmol/L — AB (ref 135–145)
TOTAL PROTEIN: 8 g/dL (ref 6.5–8.1)

## 2017-05-16 LAB — HCG, QUANTITATIVE, PREGNANCY: hCG, Beta Chain, Quant, S: 65345 m[IU]/mL — ABNORMAL HIGH (ref <5)

## 2017-05-16 LAB — CBC WITH DIFFERENTIAL/PLATELET
Basophils Absolute: 0.1 10*3/uL (ref 0.0–0.1)
Basophils Relative: 1 %
EOS ABS: 0.1 10*3/uL (ref 0.0–0.7)
Eosinophils Relative: 2 %
HCT: 37.6 % (ref 36.0–46.0)
HEMOGLOBIN: 13.1 g/dL (ref 12.0–15.0)
LYMPHS ABS: 2.8 10*3/uL (ref 0.7–4.0)
Lymphocytes Relative: 33 %
MCH: 30.9 pg (ref 26.0–34.0)
MCHC: 34.8 g/dL (ref 30.0–36.0)
MCV: 88.7 fL (ref 78.0–100.0)
MONO ABS: 0.6 10*3/uL (ref 0.1–1.0)
MONOS PCT: 7 %
NEUTROS PCT: 57 %
Neutro Abs: 5.1 10*3/uL (ref 1.7–7.7)
Platelets: 279 10*3/uL (ref 150–400)
RBC: 4.24 MIL/uL (ref 3.87–5.11)
RDW: 12.8 % (ref 11.5–15.5)
WBC: 8.7 10*3/uL (ref 4.0–10.5)

## 2017-05-16 LAB — LIPASE, BLOOD: LIPASE: 28 U/L (ref 11–51)

## 2017-05-16 MED ORDER — SODIUM CHLORIDE 0.9 % IV BOLUS (SEPSIS)
1000.0000 mL | Freq: Once | INTRAVENOUS | Status: AC
  Administered 2017-05-16: 1000 mL via INTRAVENOUS

## 2017-05-16 MED ORDER — ONDANSETRON HCL 4 MG/2ML IJ SOLN
4.0000 mg | Freq: Once | INTRAMUSCULAR | Status: AC
  Administered 2017-05-16: 4 mg via INTRAVENOUS
  Filled 2017-05-16: qty 2

## 2017-05-16 NOTE — ED Triage Notes (Signed)
Reports finding out she was pregnant on Wednesday.  Unable to eat or drink due to nausea and vomiting.  Reports lower abdominal cramping that started Thursday that was intermittant that got worse today.  Reports no bleeding.

## 2017-05-16 NOTE — ED Provider Notes (Signed)
Care transferred from Dr. Arnoldo MoraleMumma and Dr. Jodi MourningZavitz.  Please see her full H&P.    Deborah BonitoKristina Tran is a 26 y.o. female presents with known pregnancy approx 9 weeks based on last LMP.  3 days of N/V and intermittent abd pain that began after vomiting.  No vaginal bleeding, fever, chills.  Initial abd exam was benign.  No additional emesis after fluids and Zofran.   Physical Exam  BP 111/64   Pulse 80   Temp 97.7 F (36.5 C) (Oral)   Resp 16   Ht 5\' 3"  (1.6 m)   Wt 58.5 kg (129 lb)   LMP 03/13/2017 (Exact Date)   SpO2 100%   BMI 22.85 kg/m   Physical Exam  Constitutional: She appears well-developed and well-nourished. No distress.  HENT:  Head: Normocephalic.  Eyes: Conjunctivae are normal. No scleral icterus.  Neck: Normal range of motion.  Cardiovascular: Normal rate and intact distal pulses.   Pulmonary/Chest: Effort normal.  Musculoskeletal: Normal range of motion.  Neurological: She is alert.  Skin: Skin is warm and dry.  Nursing note and vitals reviewed.  Results for orders placed or performed during the hospital encounter of 05/16/17  CBC with Differential  Result Value Ref Range   WBC 8.7 4.0 - 10.5 K/uL   RBC 4.24 3.87 - 5.11 MIL/uL   Hemoglobin 13.1 12.0 - 15.0 g/dL   HCT 82.937.6 56.236.0 - 13.046.0 %   MCV 88.7 78.0 - 100.0 fL   MCH 30.9 26.0 - 34.0 pg   MCHC 34.8 30.0 - 36.0 g/dL   RDW 86.512.8 78.411.5 - 69.615.5 %   Platelets 279 150 - 400 K/uL   Neutrophils Relative % 57 %   Neutro Abs 5.1 1.7 - 7.7 K/uL   Lymphocytes Relative 33 %   Lymphs Abs 2.8 0.7 - 4.0 K/uL   Monocytes Relative 7 %   Monocytes Absolute 0.6 0.1 - 1.0 K/uL   Eosinophils Relative 2 %   Eosinophils Absolute 0.1 0.0 - 0.7 K/uL   Basophils Relative 1 %   Basophils Absolute 0.1 0.0 - 0.1 K/uL  Urinalysis, Routine w reflex microscopic  Result Value Ref Range   Color, Urine AMBER (A) YELLOW   APPearance HAZY (A) CLEAR   Specific Gravity, Urine 1.030 1.005 - 1.030   pH 6.0 5.0 - 8.0   Glucose, UA NEGATIVE  NEGATIVE mg/dL   Hgb urine dipstick NEGATIVE NEGATIVE   Bilirubin Urine NEGATIVE NEGATIVE   Ketones, ur 80 (A) NEGATIVE mg/dL   Protein, ur 30 (A) NEGATIVE mg/dL   Nitrite NEGATIVE NEGATIVE   Leukocytes, UA NEGATIVE NEGATIVE   RBC / HPF 0-5 0 - 5 RBC/hpf   WBC, UA 0-5 0 - 5 WBC/hpf   Bacteria, UA RARE (A) NONE SEEN   Squamous Epithelial / LPF 6-30 (A) NONE SEEN   Mucous PRESENT    Sperm, UA PRESENT   Lipase, blood  Result Value Ref Range   Lipase 28 11 - 51 U/L  Comprehensive metabolic panel  Result Value Ref Range   Sodium 132 (L) 135 - 145 mmol/L   Potassium 4.8 3.5 - 5.1 mmol/L   Chloride 100 (L) 101 - 111 mmol/L   CO2 23 22 - 32 mmol/L   Glucose, Bld 73 65 - 99 mg/dL   BUN 6 6 - 20 mg/dL   Creatinine, Ser 2.950.72 0.44 - 1.00 mg/dL   Calcium 9.5 8.9 - 28.410.3 mg/dL   Total Protein 8.0 6.5 - 8.1 g/dL   Albumin  4.2 3.5 - 5.0 g/dL   AST 32 15 - 41 U/L   ALT 18 14 - 54 U/L   Alkaline Phosphatase 36 (L) 38 - 126 U/L   Total Bilirubin 1.3 (H) 0.3 - 1.2 mg/dL   GFR calc non Af Amer >60 >60 mL/min   GFR calc Af Amer >60 >60 mL/min   Anion gap 9 5 - 15  hCG, quantitative, pregnancy  Result Value Ref Range   hCG, Beta Chain, Quant, S 65,345 (H) <5 mIU/mL  ABO/Rh  Result Value Ref Range   ABO/RH(D) O POS    US Ob Comp < 14 Wks  Result Date: 05/17/2017 CLINICAL DATA:  Nausea and lower abdominal cramping for 2 days EXAM: OBSTETRIC <14 WK [place] AND TRANSVAGINAL OB US TECHNIQUE: Both transabdominal and transvaginal ultrasound examinations were performed for complete evaluation of the gestation as well as the maternal uterus, adnexal regions, and pelvic cul-de-sac. Transvaginal technique was performed to assess early pregnancy. COMPARISON:  None. FINDINGS: Intrauterine gestational sac: Single Yolk sac:  Visible Embryo:  Visible Cardiac Activity: Visible Heart Rate: 141  bpm MSD:   mm    w     d CRL:  5.1  mm   6 w   2 d                  [place] EDC: 01/07/2018 Subchorionic hemorrhage:  None  visualized. Maternal uterus/adnexae: Hemorrhagic 2 cm cyst of the right ovary, probably corpus luteum. Normal left ovary. No abnormal pelvic fluid collections. IMPRESSION: Single living intrauterine gestation measuring 6 weeks 2 days by crown-rump length. This is less advanced than predicted by the stated menstrual history. Electronically Signed   By: Ellery Plunk M.D.   On: 05/17/2017 00:39   US Ob Transvaginal  Result Date: 05/17/2017 CLINICAL DATA:  Nausea and lower abdominal cramping for 2 days EXAM: OBSTETRIC <14 WK [place] AND TRANSVAGINAL OB US TECHNIQUE: Both transabdominal and transvaginal ultrasound examinations were performed for complete evaluation of the gestation as well as the maternal uterus, adnexal regions, and pelvic cul-de-sac. Transvaginal technique was performed to assess early pregnancy. COMPARISON:  None. FINDINGS: Intrauterine gestational sac: Single Yolk sac:  Visible Embryo:  Visible Cardiac Activity: Visible Heart Rate: 141  bpm MSD:   mm    w     d CRL:  5.1  mm   6 w   2 d                  [place] EDC: 01/07/2018 Subchorionic hemorrhage:  None visualized. Maternal uterus/adnexae: Hemorrhagic 2 cm cyst of the right ovary, probably corpus luteum. Normal left ovary. No abnormal pelvic fluid collections. IMPRESSION: Single living intrauterine gestation measuring 6 weeks 2 days by crown-rump length. This is less advanced than predicted by the stated menstrual history. Electronically Signed   By: Ellery Plunk M.D.   On: 05/17/2017 00:39    ED Course  Procedures  Clinical Course as of May 18 203  Sat May 16, 2017  2338 Plan: pending formal US. Plan for d/c home if no ectopic.    [HM]    Clinical Course User Index [HM] Deborah Tran, [name], PA-C     MDM Resents with nausea and vomiting. Formal ultrasound shows single living IUP at approximately 6 weeks and 2 days. Repeat abdominal exam remained soft and nontender. Patient without additional vomiting here in the emergency  department. Will be discharged home with Diclegis and instructions to follow-up with OB/GYN. Patient states understanding.  1. Nausea and vomiting during pregnancy       Milta Deiters 05/17/17 0205    Blane Ohara, MD 05/19/17 [phone]

## 2017-05-16 NOTE — ED Notes (Signed)
Patient transported to Ultrasound 

## 2017-05-16 NOTE — ED Provider Notes (Signed)
[facility]-EMERGENCY DEPT Provider Note   CSN: [id number] Arrival date & time: 05/16/17  2032     History   Chief Complaint Chief Complaint  Patient presents with  . Abdominal Pain  . Morning Sickness    HPI Deborah Tran is a 26 y.o. female.  HPI  Patient is a 26 year old female who presents to the emergency department with nausea and vomiting. Patient found out that she was pregnant on Wednesday, LMP 03/13/17. Endorses a 3 day history of nausea and vomiting, nonbloody, nonbilious. Intermittent abdominal cramping since that time, mild, nonradiating. Abdominal pain occurred following the nausea and vomiting. Denies fever, chills, chest pain, dysuria, diarrhea, bloody stool. Denies vaginal discharge, vaginal bleeding. Denies any concern for STI. Denies prior abdominal surgery. Patient has not yet had a ultrasound to confirm IUP or seen an OB.  Past Medical History:  Diagnosis Date  . Meningitis     There are no active problems to display for this patient.   No past surgical history on file.  OB History    Gravida Para Term Preterm AB Living   1             SAB TAB Ectopic Multiple Live Births                   Home Medications    Prior to Admission medications   Medication Sig Start Date End Date Taking? Authorizing Provider  ondansetron (ZOFRAN ODT) 4 MG disintegrating tablet Take 1 tablet (4 mg total) by mouth every 8 (eight) hours as needed for nausea or vomiting. Patient not taking: Reported on 05/16/2017 11/12/16   Gerhard MunchLockwood, Robert, MD    Family History No family history on file.  Social History Social History  Substance Use Topics  . Smoking status: Never Smoker  . Smokeless tobacco: Never Used  . Alcohol use Yes     Allergies   Apple fruit extract and Hydrocodone   Review of Systems Review of Systems  Constitutional: Positive for appetite change. Negative for chills and fever.  HENT: Negative for congestion.   Respiratory: Negative for cough, chest  tightness and shortness of breath.   Cardiovascular: Negative for chest pain.  Gastrointestinal: Positive for nausea and vomiting. Negative for abdominal pain and diarrhea.  Genitourinary: Negative for dysuria, flank pain, hematuria, vaginal bleeding, vaginal discharge and vaginal pain.  Musculoskeletal: Negative for back pain.  Skin: Negative for rash.  Neurological: Negative for dizziness, seizures, weakness and light-headedness.  Psychiatric/Behavioral: Negative for behavioral problems.     Physical Exam Updated Vital Signs BP 111/64   Pulse 80   Temp 97.7 F (36.5 C) (Oral)   Resp 16   Ht 5\' 3"  (1.6 m)   Wt 58.5 kg (129 lb)   LMP 03/13/2017 (Exact Date)   SpO2 100%   BMI 22.85 kg/m   Physical Exam  Constitutional: She is oriented to person, place, and time. She appears well-developed and well-nourished. No distress.  HENT:  Head: Atraumatic.  Eyes: Conjunctivae and EOM are normal. Pupils are equal, round, and reactive to light.  Neck: Normal range of motion. Neck supple.  Cardiovascular: Normal rate and intact distal pulses.   Pulmonary/Chest: Effort normal and breath sounds normal. No respiratory distress.  Abdominal: She exhibits no distension and no mass. There is no tenderness. There is no rebound and no guarding.  Negative McBurney's point, negative Murphy's sign. No CVA tenderness.  Musculoskeletal: Normal range of motion.  Neurological: She is alert and oriented to person, place,  and time.  Skin: Skin is warm. No pallor.  Psychiatric: She has a normal mood and affect.     ED Treatments / Results  Labs (all labs ordered are listed, but only abnormal results are displayed) Labs Reviewed  URINALYSIS, ROUTINE W REFLEX MICROSCOPIC - Abnormal; Notable for the following:       Result Value   Color, Urine AMBER (*)    APPearance HAZY (*)    Ketones, ur 80 (*)    Protein, ur 30 (*)    Bacteria, UA RARE (*)    Squamous Epithelial / LPF 6-30 (*)    All other  components within normal limits  COMPREHENSIVE METABOLIC PANEL - Abnormal; Notable for the following:    Sodium 132 (*)    Chloride 100 (*)    Alkaline Phosphatase 36 (*)    Total Bilirubin 1.3 (*)    All other components within normal limits  HCG, QUANTITATIVE, PREGNANCY - Abnormal; Notable for the following:    hCG, Beta Chain, Quant, S 65,345 (*)    All other components within normal limits  CBC WITH DIFFERENTIAL/PLATELET  LIPASE, BLOOD  ABO/RH    EKG  EKG Interpretation None       Radiology No results found.  Procedures Procedures (including critical care time)  Medications Ordered in ED Medications  sodium chloride 0.9 % bolus 1,000 mL (0 mLs Intravenous Stopped 05/16/17 2229)  ondansetron (ZOFRAN) injection 4 mg (4 mg Intravenous Given 05/16/17 2150)     Initial Impression / Assessment and Plan / ED Course  I have reviewed the triage vital signs and the nursing notes.  Pertinent labs & imaging results that were available during my care of the patient were reviewed by me and considered in my medical decision making (see chart for details).  Clinical Course as of May 17 4  Sat May 16, 2017  2338 Plan: pending formal US. Plan for d/c home if no ectopic.    [HM]    Clinical Course User Index [HM] Muthersbaugh, [name], PA-C   Patient is a 26 year old who presents to the emergency department with nausea and vomiting and intermittent abdominal pain for 3 days. GA [redacted] weeks based on LMP. Afebrile, hemodynamically stable. Benign abdominal exam.  Clinical picture not consistent with acute intra-abdominal infection, benign abdominal exam doubt acute appendicitis or acute cholecystitis. No prior abdominal surgeries, doubt SBO.  No significant electrolyte abnormalities. No leukocytosis. Lipase 28. Quant 65,000. Bedside ultrasound did not confirm IUP. We will obtain formal first trimester ultrasound to rule out ectopic pregnancy. Given IV fluids and antiemetics. Patient had  resolution of her emesis following antiemetics.  Care transferred to [facility] PA-C at 24:00, see her note for reevaluation, treatment, disposition of the patient. Currently ultrasound is pending.  Final Clinical Impressions(s) / ED Diagnoses   Final diagnoses:  None    New Prescriptions New Prescriptions   No medications on file     Corena Herter, MD 05/17/17 0005    Blane Ohara, MD 05/19/17 [phone]

## 2017-05-17 LAB — ABO/RH: ABO/RH(D): O POS

## 2017-05-17 MED ORDER — DOXYLAMINE-PYRIDOXINE 10-10 MG PO TBEC: DELAYED_RELEASE_TABLET | ORAL | 0 refills | Status: DC

## 2017-05-17 NOTE — Discharge Instructions (Signed)
1. Medications: Diclegis, usual home medications 2. Treatment: rest, drink plenty of fluids,  3. Follow Up: Please followup with your OB/GYN in 3-5 days for discussion of your diagnoses and further evaluation after today's visit; if you do not have a primary care doctor use the resource guide provided to find one; Please return to the ER for worsening pain, persistent vomiting or vaginal bleeding

## 2017-06-03 ENCOUNTER — Emergency Department (HOSPITAL_COMMUNITY)
Admission: EM | Admit: 2017-06-03 | Discharge: 2017-06-03 | Discharge status: Against Medical Advice | Payer: Managed Care, Other (non HMO) | Attending: Emergency Medicine | Admitting: Emergency Medicine

## 2017-06-03 DIAGNOSIS — O219 Vomiting of pregnancy, unspecified: Secondary | ICD-10-CM | POA: Diagnosis not present

## 2017-06-03 LAB — URINALYSIS, ROUTINE W REFLEX MICROSCOPIC
Bilirubin Urine: NEGATIVE
Glucose, UA: NEGATIVE mg/dL
Hgb urine dipstick: NEGATIVE
Ketones, ur: 80 mg/dL — AB
NITRITE: NEGATIVE
PROTEIN: 30 mg/dL — AB
SPECIFIC GRAVITY, URINE: 1.03 (ref 1.005–1.030)
pH: 6 (ref 5.0–8.0)

## 2017-06-03 LAB — COMPREHENSIVE METABOLIC PANEL
ALBUMIN: 4 g/dL (ref 3.5–5.0)
ALT: 10 U/L — ABNORMAL LOW (ref 14–54)
AST: 14 U/L — AB (ref 15–41)
Alkaline Phosphatase: 31 U/L — ABNORMAL LOW (ref 38–126)
Anion gap: 10 (ref 5–15)
BUN: 7 mg/dL (ref 6–20)
CO2: 21 mmol/L — AB (ref 22–32)
Calcium: 9.8 mg/dL (ref 8.9–10.3)
Chloride: 103 mmol/L (ref 101–111)
Creatinine, Ser: 0.66 mg/dL (ref 0.44–1.00)
GFR calc Af Amer: >60 mL/min (ref >60)
GFR calc non Af Amer: >60 mL/min (ref >60)
GLUCOSE: 79 mg/dL (ref 65–99)
POTASSIUM: 3.4 mmol/L — AB (ref 3.5–5.1)
SODIUM: 134 mmol/L — AB (ref 135–145)
Total Bilirubin: 0.7 mg/dL (ref 0.3–1.2)
Total Protein: 7.5 g/dL (ref 6.5–8.1)

## 2017-06-03 LAB — CBC
HCT: 31.7 % — ABNORMAL LOW (ref 36.0–46.0)
Hemoglobin: 11 g/dL — ABNORMAL LOW (ref 12.0–15.0)
MCH: 30.1 pg (ref 26.0–34.0)
MCHC: 34.7 g/dL (ref 30.0–36.0)
MCV: 86.6 fL (ref 78.0–100.0)
Platelets: 273 10*3/uL (ref 150–400)
RBC: 3.66 MIL/uL — ABNORMAL LOW (ref 3.87–5.11)
RDW: 12.5 % (ref 11.5–15.5)
WBC: 8.7 10*3/uL (ref 4.0–10.5)

## 2017-06-03 LAB — LIPASE, BLOOD: Lipase: 26 U/L (ref 11–51)

## 2017-06-03 MED ORDER — LACTATED RINGERS IV BOLUS (SEPSIS): 1000.0000 mL | Freq: Once | INTRAVENOUS | Status: DC

## 2017-06-03 MED ORDER — METOCLOPRAMIDE HCL 5 MG/ML IJ SOLN
10.0000 mg | Freq: Once | INTRAMUSCULAR | Status: DC
  Filled 2017-06-03: qty 2

## 2017-06-03 MED ORDER — FAMOTIDINE IN NACL 20-0.9 MG/50ML-% IV SOLN
20.0000 mg | Freq: Once | INTRAVENOUS | Status: DC
  Filled 2017-06-03: qty 50

## 2017-06-03 NOTE — ED Triage Notes (Signed)
Pt states that she is [redacted] weeks pregnant and unable to keep anything down for the past two days, today started having abd cramping in mid abd, denies vaginal bleeding.

## 2017-06-03 NOTE — ED Notes (Signed)
Called again with no answer.  Removed at this time.

## 2017-06-03 NOTE — ED Notes (Signed)
Pt called for x3 on 2 separate occassions for room assignment. No answer no response. Inquired about pt to triage tech who verified that pt was no longer in waiting area.

## 2017-06-03 NOTE — ED Notes (Signed)
Pt called in waiting but no answer

## 2017-06-05 LAB — URINE CULTURE

## 2017-07-11 ENCOUNTER — Encounter (HOSPITAL_COMMUNITY): Payer: Self-pay | Admitting: Emergency Medicine

## 2017-07-11 ENCOUNTER — Emergency Department (HOSPITAL_COMMUNITY): Payer: Managed Care, Other (non HMO)

## 2017-07-11 ENCOUNTER — Emergency Department (HOSPITAL_COMMUNITY)
Admission: EM | Admit: 2017-07-11 | Discharge: 2017-07-11 | Disposition: A | Discharge status: Home / Self Care | Payer: Managed Care, Other (non HMO) | Attending: Emergency Medicine | Admitting: Emergency Medicine

## 2017-07-11 DIAGNOSIS — J029 Acute pharyngitis, unspecified: Secondary | ICD-10-CM

## 2017-07-11 DIAGNOSIS — R070 Pain in throat: Secondary | ICD-10-CM | POA: Diagnosis present

## 2017-07-11 LAB — POC URINE PREG, ED: PREG TEST UR: POSITIVE — AB

## 2017-07-11 LAB — RAPID STREP SCREEN (MED CTR MEBANE ONLY): STREPTOCOCCUS, GROUP A SCREEN (DIRECT): NEGATIVE

## 2017-07-11 MED ORDER — LIDOCAINE VISCOUS 2 % MT SOLN: 20.0000 mL | OROMUCOSAL | 0 refills | Status: AC | PRN

## 2017-07-11 MED ORDER — IBUPROFEN 400 MG PO TABS: 400.0000 mg | ORAL_TABLET | Freq: Four times a day (QID) | ORAL | 0 refills | Status: AC | PRN

## 2017-07-11 MED ORDER — KETOROLAC TROMETHAMINE 15 MG/ML IJ SOLN
15.0000 mg | Freq: Once | INTRAMUSCULAR | Status: AC
  Administered 2017-07-11: 15 mg via INTRAMUSCULAR
  Filled 2017-07-11: qty 1

## 2017-07-11 MED ORDER — IOPAMIDOL (ISOVUE-300) INJECTION 61%
INTRAVENOUS | Status: AC
  Filled 2017-07-11: qty 75

## 2017-07-11 MED ORDER — DEXAMETHASONE SODIUM PHOSPHATE 10 MG/ML IJ SOLN
10.0000 mg | Freq: Once | INTRAMUSCULAR | Status: AC
  Administered 2017-07-11: 10 mg via INTRAMUSCULAR
  Filled 2017-07-11: qty 1

## 2017-07-11 MED ORDER — IOPAMIDOL (ISOVUE-300) INJECTION 61%
75.0000 mL | Freq: Once | INTRAVENOUS | Status: AC | PRN
  Administered 2017-07-11: 75 mL via INTRAVENOUS

## 2017-07-11 NOTE — Discharge Instructions (Signed)
Your CT scan shows no signs of abscess just swelling , which the steroids should help with over the next few days. You will be called in the next few days if your culture results come back positive and you need to be started on antibiotics. Take ibuprofen every 6 hours as needed for pain, make also use lidocaine solution for pain relief. Follow-up with ENT in the next few days to  monitor swelling and make sure it is improving. Return to ED if swelling or pain worsens, if you are unable to drink fluids or tolerate secretions, or you feel short of breath.

## 2017-07-11 NOTE — ED Triage Notes (Signed)
Pt reports sore throat and swollen tonsils for the past 2 days.

## 2017-07-11 NOTE — ED Provider Notes (Signed)
[facility]-EMERGENCY DEPT Provider Note   CSN: [id number] Arrival date & time: 07/11/17  3567     History   Chief Complaint Chief Complaint  Patient presents with  . Sore Throat    HPI  Deborah Tran is a 26 y.o. Female with no pertinent PMH, presents for evaluation of sore throat. Patient reports mild sore throat starting on Monday, which seemed to come and go throughout the week. She took Nyquil periodically with some relief. She reports she woke up with morning with much more severe throat pain, and feels like her tonsils are very swollen. She reports she has pain with swallowing, but is able to drink water and tolerate secretions. She reports when she woke up her voice was very hoarse. She reports some associated non-productive cough, denies shortness of breath, fever, chills, rhinorrhea, ear pain or sinus pressure. Reports 4 previous strep throat infections this year, denies history of peritonsillar abscess.       Past Medical History:  Diagnosis Date  . Meningitis     There are no active problems to display for this patient.   History reviewed. No pertinent surgical history.  OB History    Gravida Para Term Preterm AB Living   1             SAB TAB Ectopic Multiple Live Births                   Home Medications    Prior to Admission medications   Medication Sig Start Date End Date Taking? Authorizing Provider  Doxylamine-Pyridoxine 10-10 MG TBEC Take 2 tabs at bedtime. If symptoms are controlled, continue taking 2 tabs at bedtime. If symptoms persist, take 2 tabs at bedtime, then 1 tab in the morning of Day 3 and 2 tabs at bedtime. If symptoms are controlled on Day 4, continue as scheduled. If symptoms are not controlled, increase dose to 1 tab in the morning, 1 tab midafternoon, and 2 tabs in the evening. Take as scheduled and not on an as needed basis. Max: 4 tabs daily. 05/17/17   Muthersbaugh, Boyd Kerbs    Family History History reviewed. No pertinent family  history.  Social History Social History  Substance Use Topics  . Smoking status: Never Smoker  . Smokeless tobacco: Never Used  . Alcohol use Yes     Allergies   Apple fruit extract and Hydrocodone   Review of Systems Review of Systems  Constitutional: Negative for chills and fever.  HENT: Positive for sore throat and voice change. Negative for ear pain, facial swelling, rhinorrhea, sinus pain and trouble swallowing.   Eyes: Negative for pain and discharge.  Respiratory: Positive for cough. Negative for chest tightness, shortness of breath, wheezing and stridor.   Cardiovascular: Negative for chest pain and palpitations.  Gastrointestinal: Negative for abdominal pain, nausea and vomiting.  Genitourinary: Negative for difficulty urinating and dyspareunia.  Musculoskeletal: Negative for arthralgias and myalgias.  Skin: Negative for color change and rash.  Neurological: Negative for dizziness, light-headedness and headaches.     Physical Exam Updated Vital Signs BP 121/88 (BP Location: Left Arm)   Pulse 79   Temp 98.2 F (36.8 C) (Oral)   Resp 18   LMP 03/13/2017 (Exact Date)   SpO2 99%   Physical Exam  Constitutional: She appears well-developed and well-nourished. No distress.  HENT:  Head: Normocephalic and atraumatic.  Right Ear: Tympanic membrane and external ear normal.  Left Ear: Tympanic membrane and external ear normal.  Nose:  No mucosal edema or rhinorrhea.  Mouth/Throat: Uvula is midline. No trismus in the jaw. Uvula swelling present. Posterior oropharyngeal edema and posterior oropharyngeal erythema present. No oropharyngeal exudate or tonsillar abscesses.  Right tonsil very swollen in comparison to left, uvula midline with swelling, mild erythema, no exudates  Eyes: Right eye exhibits no discharge. Left eye exhibits no discharge.  Neck: Neck supple.  Mild cervical lymphadenopathy  Cardiovascular: Normal rate, regular rhythm and normal heart sounds.     Pulmonary/Chest: Effort normal and breath sounds normal. No stridor. No respiratory distress. She has no wheezes.  Abdominal: Soft. Bowel sounds are normal. There is no tenderness. There is no guarding.  Lymphadenopathy:    She has cervical adenopathy.  Neurological: She is alert. Coordination normal.  Skin: Skin is warm and dry. Capillary refill takes less than 2 seconds. No rash noted. She is not diaphoretic. No erythema.  Psychiatric: She has a normal mood and affect. Her behavior is normal.  Nursing note and vitals reviewed.    ED Treatments / Results  Labs (all labs ordered are listed, but only abnormal results are displayed) Labs Reviewed  POC URINE PREG, ED - Abnormal; Notable for the following:       Result Value   Preg Test, Ur POSITIVE (*)    All other components within normal limits  RAPID STREP SCREEN (NOT AT [facility])  CULTURE, GROUP A STREP [facility])    EKG  EKG Interpretation None       Radiology Ct Soft Tissue Neck W Contrast  Result Date: 07/11/2017 CLINICAL DATA:  Sore throat and swollen tonsils for 2 days. EXAM: CT NECK WITH CONTRAST TECHNIQUE: Multidetector CT imaging of the neck was performed using the standard protocol following the bolus administration of intravenous contrast. CONTRAST:  <See Chart> ISOVUE-300 IOPAMIDOL (ISOVUE-300) INJECTION 61% COMPARISON:  None. FINDINGS: Pharynx and larynx: Striated pattern of enhancement and enlargement of both tonsils and adenoids consistent with pharyngitis/ tonsillitis/ adenoiditis. No tonsillar abscess. No peritonsillar abscess or inflammation. No retropharyngeal effusion or abscess. Moderate uvular edema. Unremarkable epiglottis. Normal larynx. Moderate uvular edema. No epiglottic enlargement. No retropharyngeal abscess or edema. Salivary glands: No inflammation, mass, or stone. Thyroid: Normal. Lymph nodes: Mild reactive adenopathy. No concerning areas of nodal enlargement, necrosis, or extranodal inflammation. Vascular:  Patent. Limited intracranial: Negative. Visualized orbits: Not visualized. Mastoids and visualized paranasal sinuses:  Unremarkable. Skeleton: No worrisome osseous lesion. Upper chest: No nodule or pneumothorax.  No visible effusion. Other: None. IMPRESSION: Tonsillar and adenoidal inflammation without abscess or focal fluid collection. Electronically Signed   By: Elsie Stain M.D.   On: 07/11/2017 12:04    Procedures Procedures (including critical care time)  Medications Ordered in ED Medications  ketorolac (TORADOL) 15 MG/ML injection 15 mg (15 mg Intramuscular Given 07/11/17 1028)  dexamethasone (DECADRON) injection 10 mg (10 mg Intramuscular Given 07/11/17 1028)  iopamidol (ISOVUE-300) 61 % injection 75 mL (75 mLs Intravenous Contrast Given 07/11/17 1128)     Initial Impression / Assessment and Plan / ED Course  I have reviewed the triage vital signs and the nursing notes.  Pertinent labs & imaging results that were available during my care of the patient were reviewed by me and considered in my medical decision making (see chart for details).  Patient presents with sore throat.  Patient does not appear to be in respiratory distress, able to tolerate secretions. Rapid strep negative, culture sent. Given unsymmetrical swelling pattern and acute worsening overnight. will obtain neck CT to rule out  abscess. Patient was recently pregnant and had D&C 2 weeks ago, urine preg likely still positive from previous pregnancy. Patient reports she has not been sexually active since D&C. Decadron and toradol given for pain and swelling.   CT neck shows no PTA or retropharyngeal abscess. Patient reports pani is improved. Discussed results with patient, and informed her that if her culture results come back positive she will be contacted and given an prescription for ABX. Instructed patient to use ibuprofen and viscous lidocaine for pain relief. Patient to follow up with ENT in the next few days to monitor  swelling. Return precautions provided.  Patient discussed with Dr. Jeraldine Loots, who saw patient as well and agrees with plan.   Final Clinical Impressions(s) / ED Diagnoses   Final diagnoses:  Pharyngitis, unspecified etiology    New Prescriptions Discharge Medication List as of 07/11/2017 12:30 PM    START taking these medications   Details  ibuprofen (ADVIL,MOTRIN) 400 MG tablet Take 1 tablet (400 mg total) by mouth every 6 (six) hours as needed., Starting Sat 07/11/2017, Print    lidocaine (XYLOCAINE) 2 % solution Use as directed 20 mLs in the mouth or throat as needed for mouth pain., Starting Sat 07/11/2017, Print           Dartha Lodge, [place] 07/11/17 1641    Gerhard Munch, MD 07/15/17 [phone]

## 2017-07-13 LAB — CULTURE, GROUP A STREP (THRC)

## 2017-07-20 ENCOUNTER — Encounter (HOSPITAL_COMMUNITY): Payer: Self-pay | Admitting: Emergency Medicine

## 2017-07-20 ENCOUNTER — Emergency Department (HOSPITAL_COMMUNITY)
Admission: EM | Admit: 2017-07-20 | Discharge: 2017-07-20 | Disposition: A | Discharge status: Home / Self Care | Payer: No Typology Code available for payment source

## 2017-07-20 ENCOUNTER — Emergency Department (HOSPITAL_COMMUNITY)
Admission: EM | Admit: 2017-07-20 | Discharge: 2017-07-20 | Disposition: A | Discharge status: Home / Self Care | Payer: Managed Care, Other (non HMO) | Attending: Emergency Medicine | Admitting: Emergency Medicine

## 2017-07-20 DIAGNOSIS — F10920 Alcohol use, unspecified with intoxication, uncomplicated: Secondary | ICD-10-CM | POA: Diagnosis not present

## 2017-07-20 LAB — URINALYSIS, ROUTINE W REFLEX MICROSCOPIC
BILIRUBIN URINE: NEGATIVE
GLUCOSE, UA: NEGATIVE mg/dL
HGB URINE DIPSTICK: NEGATIVE
KETONES UR: NEGATIVE mg/dL
Leukocytes, UA: NEGATIVE
Nitrite: NEGATIVE
PH: 7 (ref 5.0–8.0)
PROTEIN: NEGATIVE mg/dL
Specific Gravity, Urine: 1.017 (ref 1.005–1.030)

## 2017-07-20 LAB — PREGNANCY, URINE: Preg Test, Ur: POSITIVE — AB

## 2017-07-20 MED ORDER — SODIUM CHLORIDE 0.9 % IV BOLUS (SEPSIS): 1000.0000 mL | Freq: Once | INTRAVENOUS | Status: DC

## 2017-07-20 MED ORDER — ONDANSETRON 8 MG PO TBDP: 8.0000 mg | ORAL_TABLET | Freq: Once | ORAL | Status: DC

## 2017-07-20 MED ORDER — ONDANSETRON HCL 4 MG/2ML IJ SOLN: 4.0000 mg | Freq: Once | INTRAMUSCULAR | Status: DC

## 2017-07-20 NOTE — ED Provider Notes (Signed)
[facility]-EMERGENCY DEPT Provider Note   CSN: [id number] Arrival date & time: 07/20/17  0300     History   Chief Complaint Chief Complaint  Patient presents with  . Alcohol Problem    HPI Deborah Tran is a 26 y.o. female.  HPI  This a 26 year old female who presents acutely intoxicated. She called EMS after she started vomiting. She reports that she drank 3 liquor drinks and "I have alcohol poisoning can you pump my stomach."  Reports persistent nausea. Denies any other ingestions.  Past Medical History:  Diagnosis Date  . Meningitis     There are no active problems to display for this patient.   History reviewed. No pertinent surgical history.  OB History    Gravida Para Term Preterm AB Living   1             SAB TAB Ectopic Multiple Live Births                   Home Medications    Prior to Admission medications   Medication Sig Start Date End Date Taking? Authorizing Provider  Doxylamine-Pyridoxine 10-10 MG TBEC Take 2 tabs at bedtime. If symptoms are controlled, continue taking 2 tabs at bedtime. If symptoms persist, take 2 tabs at bedtime, then 1 tab in the morning of Day 3 and 2 tabs at bedtime. If symptoms are controlled on Day 4, continue as scheduled. If symptoms are not controlled, increase dose to 1 tab in the morning, 1 tab midafternoon, and 2 tabs in the evening. Take as scheduled and not on an as needed basis. Max: 4 tabs daily. Patient not taking: Reported on 07/11/2017 05/17/17   Muthersbaugh, [name], PA-C  ibuprofen (ADVIL,MOTRIN) 400 MG tablet Take 1 tablet (400 mg total) by mouth every 6 (six) hours as needed. 07/11/17   Dartha Lodge, PA-C  lidocaine (XYLOCAINE) 2 % solution Use as directed 20 mLs in the mouth or throat as needed for mouth pain. 07/11/17   Dartha Lodge, PA-C    Family History No family history on file.  Social History Social History  Substance Use Topics  . Smoking status: Never Smoker  . Smokeless tobacco: Never Used  .  Alcohol use Yes     Allergies   Apple fruit extract and Hydrocodone   Review of Systems Review of Systems  Constitutional: Negative for fever.  Gastrointestinal: Positive for nausea and vomiting. Negative for abdominal pain.  All other systems reviewed and are negative.    Physical Exam Updated Vital Signs BP 109/71 (BP Location: Left Arm)   Pulse 100   Temp 98.6 F (37 C)   Resp 16   Ht 5\' 3"  (1.6 m)   Wt 61.2 kg (135 lb)   LMP 03/13/2017 (Exact Date) Comment: no longer pregnant per pt   SpO2 100%   BMI 23.91 kg/m   Physical Exam  Constitutional: She is oriented to person, place, and time. She appears well-developed and well-nourished.  Intoxicated, ABC's intact, no acute distress  HENT:  Head: Normocephalic and atraumatic.  Eyes: Pupils are equal, round, and reactive to light.  Cardiovascular: Normal rate, regular rhythm and normal heart sounds.   Pulmonary/Chest: Effort normal and breath sounds normal. No respiratory distress. She has no wheezes.  Abdominal: Soft. There is no tenderness.  Neurological: She is oriented to person, place, and time.  Sleepy but arousable and able to provide history  Skin: Skin is warm and dry.  Psychiatric: She has a normal  mood and affect.  Nursing note and vitals reviewed.    ED Treatments / Results  Labs (all labs ordered are listed, but only abnormal results are displayed) Labs Reviewed  URINALYSIS, ROUTINE W REFLEX MICROSCOPIC  PREGNANCY, URINE    EKG  EKG Interpretation None       Radiology No results found.  Procedures Procedures (including critical care time)  Medications Ordered in ED Medications  ondansetron (ZOFRAN-ODT) disintegrating tablet 8 mg (not administered)     Initial Impression / Assessment and Plan / ED Course  I have reviewed the triage vital signs and the nursing notes.  Pertinent labs & imaging results that were available during my care of the patient were reviewed by me and  considered in my medical decision making (see chart for details).     Patient presents with alcohol intoxication. She is requesting that her stomach be pumped. She is sleepy but arousable and able to provide history. I discussed with her that I'm happy to provide her nausea medication and hydrate her; however, there is no indication for any further management. She is well-appearing on exam. When the nurse went to place an IV, patient sleeping comfortably. IV was deferred as were fluids. On my recheck this morning, patient is arousable. She reports some persistent nausea. She was redosed Zofran ODT. She will need to ambulate and eat prior to discharge. Signed out to Dr. Patria Maneampos.  Final Clinical Impressions(s) / ED Diagnoses   Final diagnoses:  Alcoholic intoxication without complication [facility](HCC)    New Prescriptions New Prescriptions   No medications on file     Shon BatonHorton, Zackory Pudlo F, MD 07/20/17 [phone]

## 2017-07-20 NOTE — ED Notes (Signed)
Pt given some sips of water to drink which was tolerated well. She was too lethargic to attempt to drink more or get urine specimen.

## 2017-07-20 NOTE — ED Notes (Signed)
[facility] Ex[facility]:  Expected time:  Means of arrival:  Comments: Hold for 13

## 2017-07-20 NOTE — ED Provider Notes (Signed)
Pt awake and ambulatory to the bathroom. Stable for discharge from the ER. Tolerating fluids   Deborah Tran, Deborah Mcgurn, MD 07/20/17 [phone]

## 2017-07-20 NOTE — ED Triage Notes (Signed)
Pt ETOH, pt called EMS after she started to N/V  V/s 114/76, hr 78, 16rr. Alert x 4.

## 2017-07-20 NOTE — ED Notes (Signed)
Bed: [facility]13 Expected date:  Expected time:  Means of arrival:  Comments: Ems  

## 2017-07-20 NOTE — ED Notes (Signed)
Pt's roommate, Cherlyn CushingShatequa Bowling, would like to be contacted upon discharge at [phone].

## 2017-07-20 NOTE — ED Notes (Signed)
Pt ambulated to restroom with no help.

## 2017-11-11 IMAGING — CT CT NECK W/ CM
3 of 4 series · 13 of 33 positions shown, 16 images · IV contrast (iopamidol)
Comparison: None.

CLINICAL DATA: Sore throat and swollen tonsils for 2 days.

EXAM:
CT NECK WITH CONTRAST
TECHNIQUE: Multidetector CT imaging of the neck was performed using the
standard protocol following the bolus administration of intravenous
contrast.
CONTRAST:  <See Chart> RP1NWO-4GG IOPAMIDOL (RP1NWO-4GG) INJECTION
61%

[Series 2: neck with st · axial · 0.32mm/px · z∈[-182,-42]mm · 5 of 106 slices shown, 7 images]
[im 18/106  soft-tissue]
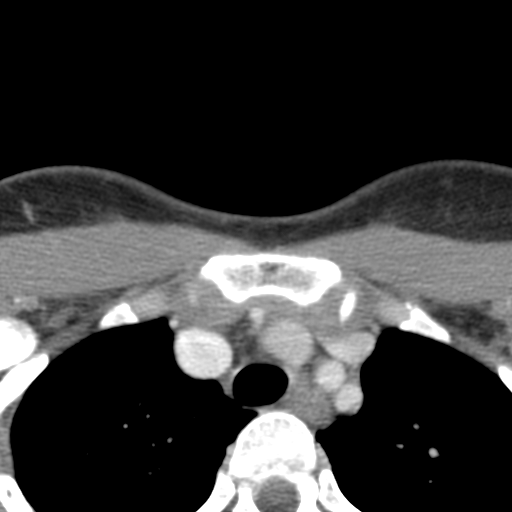
[im 18/106  bone]
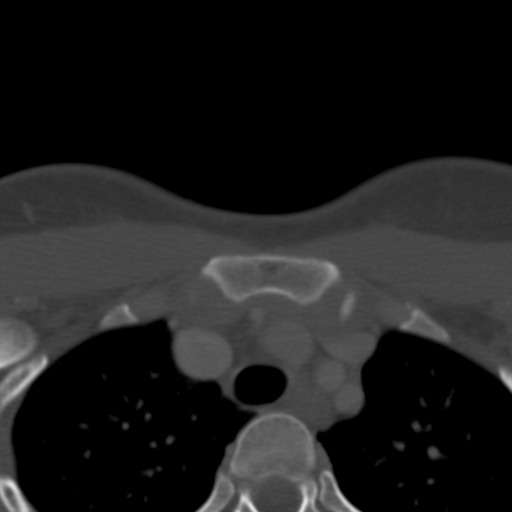
[im 36/106  bone]
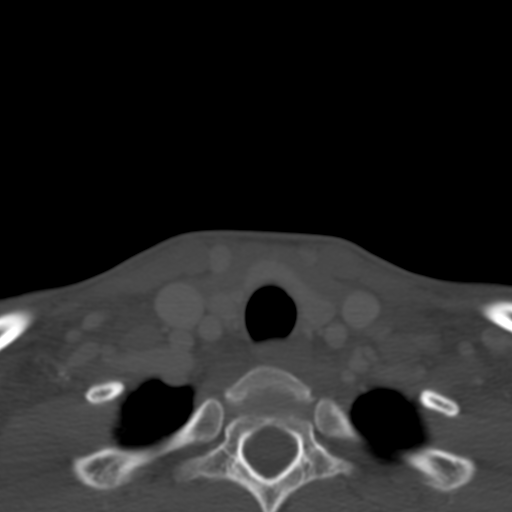
[im 53/106  bone]
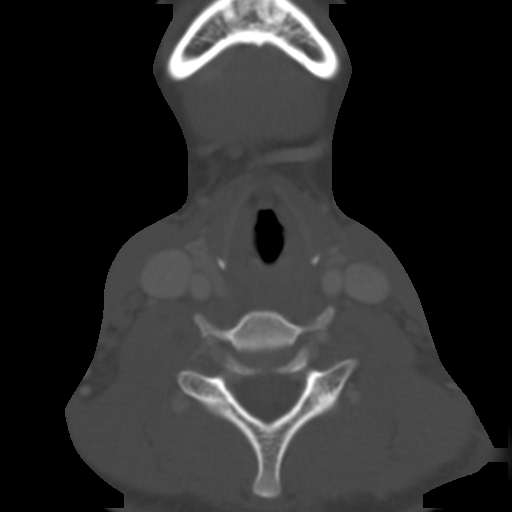
[im 71/106  bone]
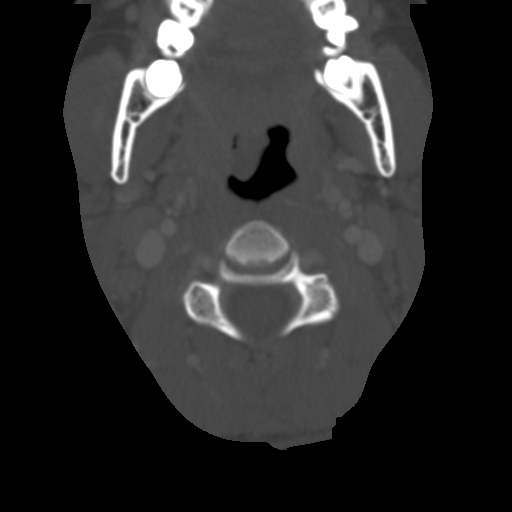
[im 88/106  soft-tissue]
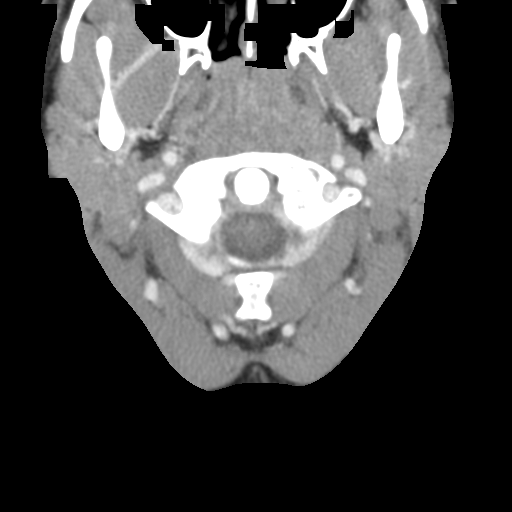
[im 88/106  bone]
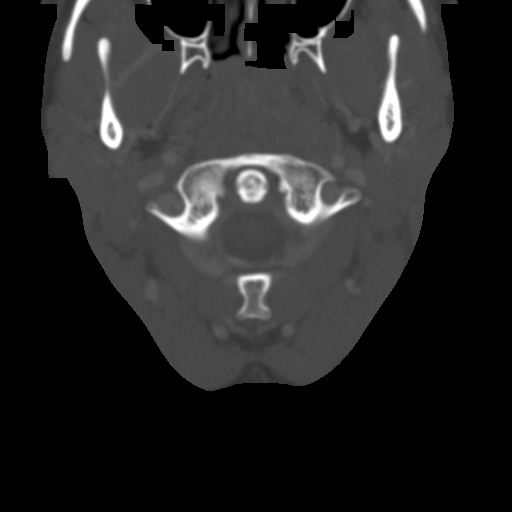

[Series 5: coronal st · coronal · 0.34mm/px · 3 of 101 slices shown]
[im 30/101  bone]
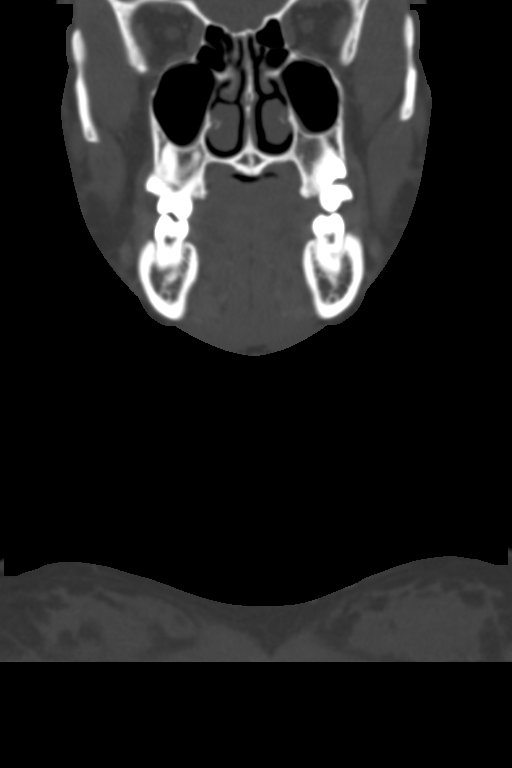
[im 44/101  bone]
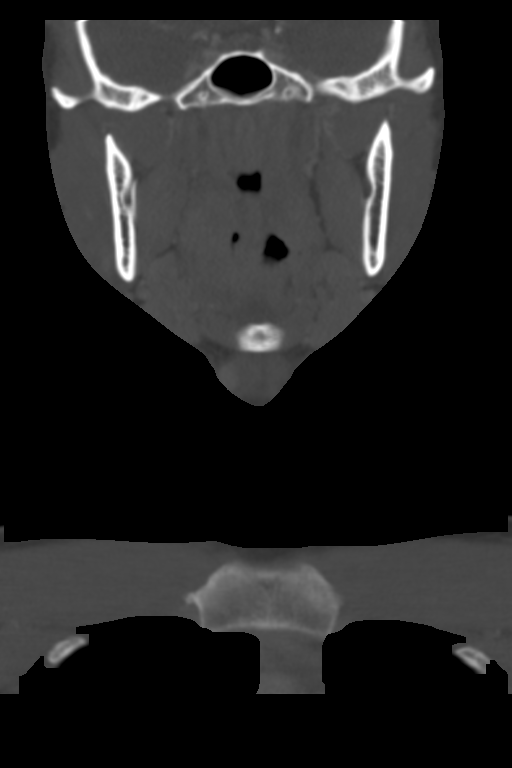
[im 58/101  bone]
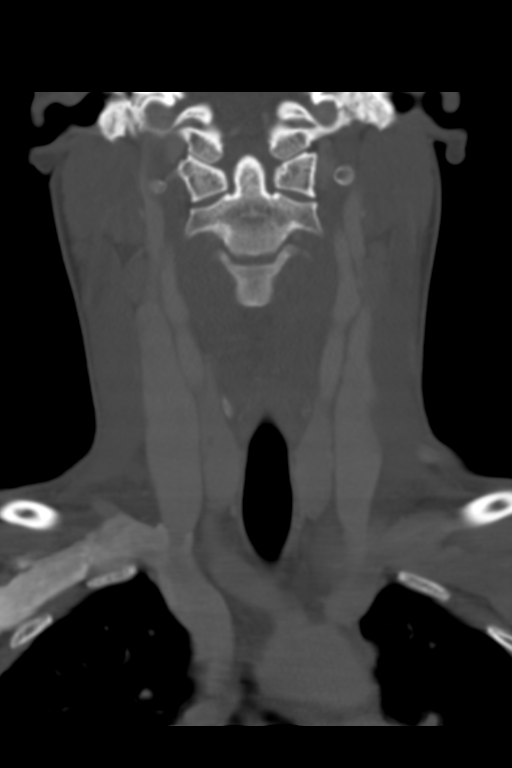

[Series 6: sagittal st · sagittal · 0.40mm/px · 5 of 79 slices shown, 6 images]
[im 27/79  bone]
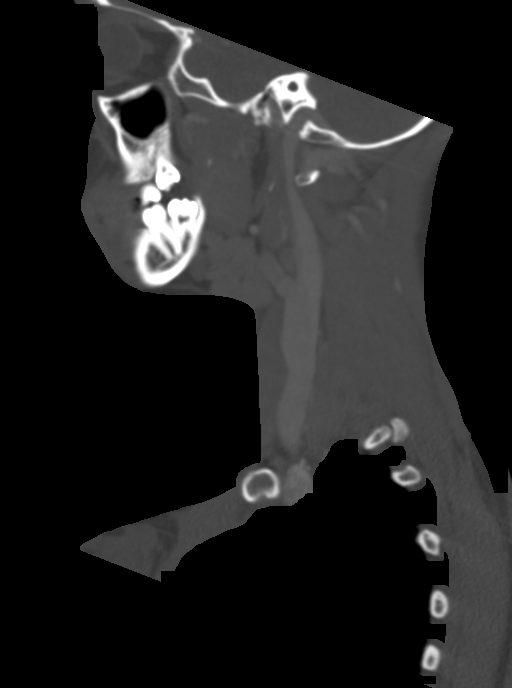
[im 33/79  bone]
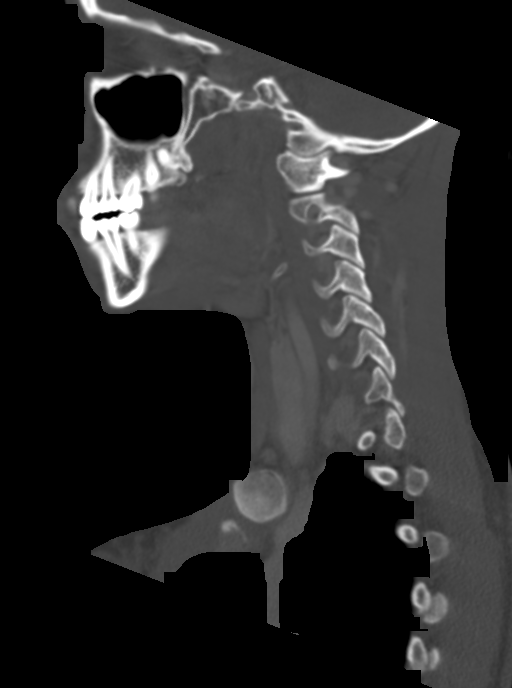
[im 40/79  soft-tissue]
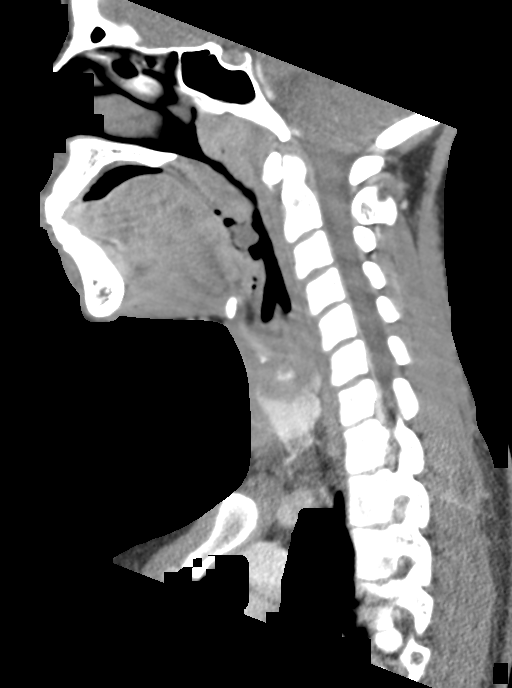
[im 40/79  bone]
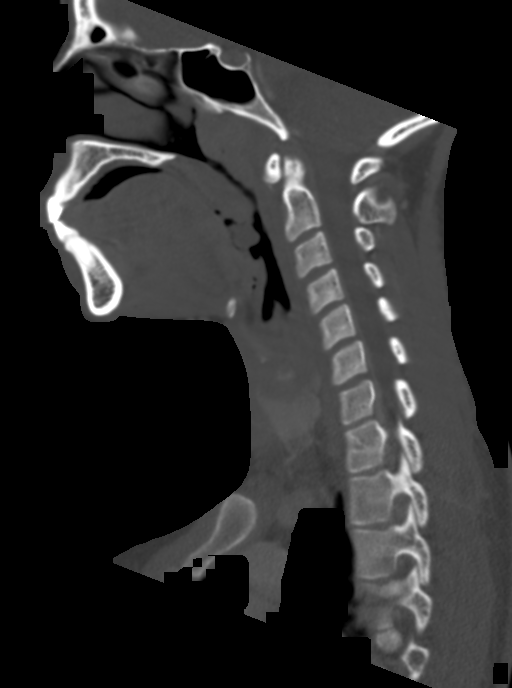
[im 46/79  bone]
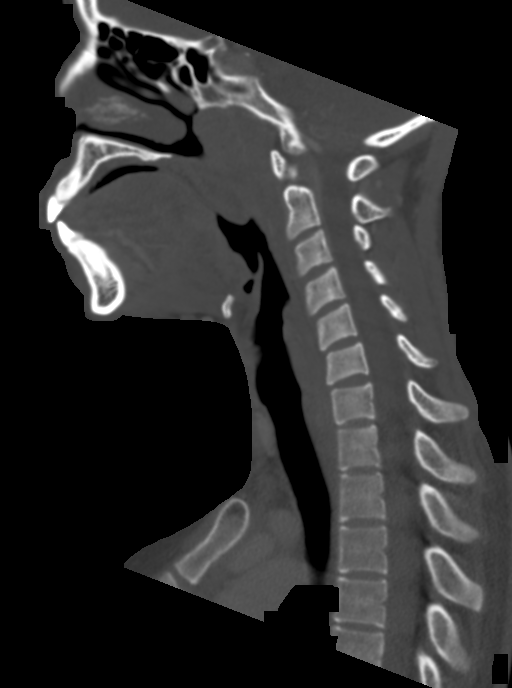
[im 53/79  bone]
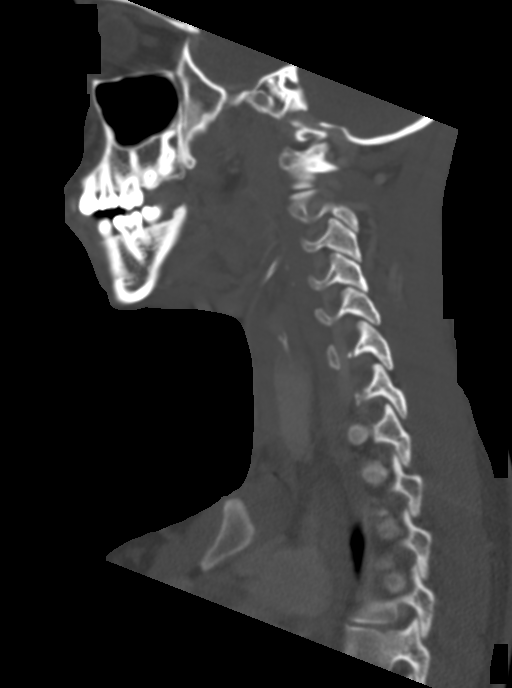

[13 of 33 positions shown; findings below may reference images not displayed]

FINDINGS: Pharynx and larynx: Striated pattern of enhancement and enlargement
of both tonsils and adenoids consistent with pharyngitis/
tonsillitis/ adenoiditis. No tonsillar abscess. No peritonsillar
abscess or inflammation. No retropharyngeal effusion or abscess.
Moderate uvular edema. Unremarkable epiglottis. Normal larynx.
Moderate uvular edema. No epiglottic enlargement. No retropharyngeal
abscess or edema.

Salivary glands: No inflammation, mass, or stone.

Thyroid: Normal.

Lymph nodes: Mild reactive adenopathy. No concerning areas of nodal
enlargement, necrosis, or extranodal inflammation.

Vascular: Patent.

Limited intracranial: Negative.

Visualized orbits: Not visualized.

Mastoids and visualized paranasal sinuses:  Unremarkable.

Skeleton: No worrisome osseous lesion.

Upper chest: No nodule or pneumothorax.  No visible effusion.

Other: None.
IMPRESSION: Tonsillar and adenoidal inflammation without abscess or focal fluid
collection.

## 2018-02-02 ENCOUNTER — Encounter (HOSPITAL_COMMUNITY): Payer: Self-pay | Admitting: Emergency Medicine

## 2018-02-02 DIAGNOSIS — R197 Diarrhea, unspecified: Secondary | ICD-10-CM | POA: Insufficient documentation

## 2018-02-02 DIAGNOSIS — R112 Nausea with vomiting, unspecified: Secondary | ICD-10-CM | POA: Insufficient documentation

## 2018-02-02 LAB — CBC
HCT: 37.9 % (ref 36.0–46.0)
HEMOGLOBIN: 12.7 g/dL (ref 12.0–15.0)
MCH: 29.8 pg (ref 26.0–34.0)
MCHC: 33.5 g/dL (ref 30.0–36.0)
MCV: 89 fL (ref 78.0–100.0)
Platelets: 264 10*3/uL (ref 150–400)
RBC: 4.26 MIL/uL (ref 3.87–5.11)
RDW: 13.1 % (ref 11.5–15.5)
WBC: 7.2 10*3/uL (ref 4.0–10.5)

## 2018-02-02 LAB — COMPREHENSIVE METABOLIC PANEL
ALBUMIN: 4.3 g/dL (ref 3.5–5.0)
ALT: 15 U/L (ref 14–54)
ANION GAP: 10 (ref 5–15)
AST: 19 U/L (ref 15–41)
Alkaline Phosphatase: 57 U/L (ref 38–126)
BUN: 9 mg/dL (ref 6–20)
CALCIUM: 9.4 mg/dL (ref 8.9–10.3)
CO2: 23 mmol/L (ref 22–32)
Chloride: 104 mmol/L (ref 101–111)
Creatinine, Ser: 0.86 mg/dL (ref 0.44–1.00)
GFR calc Af Amer: >60 mL/min (ref >60)
GFR calc non Af Amer: >60 mL/min (ref >60)
GLUCOSE: 97 mg/dL (ref 65–99)
POTASSIUM: 3.7 mmol/L (ref 3.5–5.1)
SODIUM: 137 mmol/L (ref 135–145)
Total Bilirubin: 1.2 mg/dL (ref 0.3–1.2)
Total Protein: 8.5 g/dL — ABNORMAL HIGH (ref 6.5–8.1)

## 2018-02-02 LAB — I-STAT BETA HCG BLOOD, ED (MC, WL, AP ONLY): I-stat hCG, quantitative: <5 m[IU]/mL (ref <5)

## 2018-02-02 LAB — URINALYSIS, ROUTINE W REFLEX MICROSCOPIC
BACTERIA UA: NONE SEEN
Bilirubin Urine: NEGATIVE
GLUCOSE, UA: NEGATIVE mg/dL
HGB URINE DIPSTICK: NEGATIVE
Ketones, ur: 20 mg/dL — AB
Leukocytes, UA: NEGATIVE
NITRITE: NEGATIVE
PROTEIN: 30 mg/dL — AB
Specific Gravity, Urine: 1.032 — ABNORMAL HIGH (ref 1.005–1.030)
pH: 5 (ref 5.0–8.0)

## 2018-02-02 LAB — LIPASE, BLOOD: LIPASE: 32 U/L (ref 11–51)

## 2018-02-02 NOTE — ED Triage Notes (Signed)
Pt reports lower abd pain, vomiting and diarrhea onset today. Reports that she is unable to keep anything down, including water. Subjective fever, chills.

## 2018-02-03 ENCOUNTER — Emergency Department (HOSPITAL_COMMUNITY)
Admission: EM | Admit: 2018-02-03 | Discharge: 2018-02-03 | Disposition: A | Discharge status: Home / Self Care | Payer: Managed Care, Other (non HMO) | Attending: Emergency Medicine | Admitting: Emergency Medicine

## 2018-02-03 DIAGNOSIS — R112 Nausea with vomiting, unspecified: Secondary | ICD-10-CM

## 2018-02-03 DIAGNOSIS — R197 Diarrhea, unspecified: Secondary | ICD-10-CM

## 2018-02-03 MED ORDER — SODIUM CHLORIDE 0.9 % IV BOLUS (SEPSIS)
1000.0000 mL | Freq: Once | INTRAVENOUS | Status: AC
  Administered 2018-02-03: 1000 mL via INTRAVENOUS

## 2018-02-03 MED ORDER — ONDANSETRON HCL 4 MG/2ML IJ SOLN
4.0000 mg | Freq: Once | INTRAMUSCULAR | Status: AC
  Administered 2018-02-03: 4 mg via INTRAVENOUS
  Filled 2018-02-03: qty 2

## 2018-02-03 MED ORDER — DICYCLOMINE HCL 10 MG/ML IM SOLN
20.0000 mg | Freq: Once | INTRAMUSCULAR | Status: AC
  Administered 2018-02-03: 20 mg via INTRAMUSCULAR
  Filled 2018-02-03: qty 2

## 2018-02-03 MED ORDER — ONDANSETRON 8 MG PO TBDP: 8.0000 mg | ORAL_TABLET | Freq: Three times a day (TID) | ORAL | 0 refills | Status: AC | PRN

## 2018-02-03 NOTE — ED Notes (Signed)
EDP at bedside  

## 2018-02-03 NOTE — ED Provider Notes (Signed)
[facility] EMERGENCY DEPARTMENT Provider Note   CSN: [id number] Arrival date & time: 02/02/18  2235     History   Chief Complaint Chief Complaint  Patient presents with  . Abdominal Pain    HPI Deborah Tran is a 27 y.o. female.  HPI Deborah Tran is a 27 y.o. female presents to ED with complaint of nausea, vomiting, abdominal pain.  Patient states she woke up yesterday morning with nausea and abdominal cramping.  She states that her symptoms got worse throughout the day.  She is unable to keep anything down including water.  She has not tried any medications.  She reports several episodes of diarrhea but states, that the nausea and vomiting is what is bothering her the most.  She reports lower abdominal pain.  Denies any urinary symptoms.  No back pain.  No vaginal discharge or bleeding.  Denies being pregnant.  Denies any fever or chills.  No upper respiratory symptoms.  No one around her sick with the same.  Past Medical History:  Diagnosis Date  . Meningitis     There are no active problems to display for this patient.   History reviewed. No pertinent surgical history.  OB History    Gravida Para Term Preterm AB Living   1             SAB TAB Ectopic Multiple Live Births                   Home Medications    Prior to Admission medications   Medication Sig Start Date End Date Taking? Authorizing Provider  Doxylamine-Pyridoxine 10-10 MG TBEC Take 2 tabs at bedtime. If symptoms are controlled, continue taking 2 tabs at bedtime. If symptoms persist, take 2 tabs at bedtime, then 1 tab in the morning of Day 3 and 2 tabs at bedtime. If symptoms are controlled on Day 4, continue as scheduled. If symptoms are not controlled, increase dose to 1 tab in the morning, 1 tab midafternoon, and 2 tabs in the evening. Take as scheduled and not on an as needed basis. Max: 4 tabs daily. Patient not taking: Reported on 07/11/2017 05/17/17   Muthersbaugh, [name], PA-C    ibuprofen (ADVIL,MOTRIN) 400 MG tablet Take 1 tablet (400 mg total) by mouth every 6 (six) hours as needed. 07/11/17   Dartha Lodge, PA-C  lidocaine (XYLOCAINE) 2 % solution Use as directed 20 mLs in the mouth or throat as needed for mouth pain. 07/11/17   Dartha Lodge, PA-C    Family History History reviewed. No pertinent family history.  Social History Social History   Tobacco Use  . Smoking status: Never Smoker  . Smokeless tobacco: Never Used  Substance Use Topics  . Alcohol use: Yes  . Drug use: No     Allergies   Apple fruit extract and Hydrocodone   Review of Systems Review of Systems  Constitutional: Positive for fatigue. Negative for chills and fever.  Respiratory: Negative for cough, chest tightness and shortness of breath.   Cardiovascular: Negative for chest pain, palpitations and leg swelling.  Gastrointestinal: Positive for abdominal pain, diarrhea, nausea and vomiting.  Genitourinary: Negative for dysuria, flank pain, pelvic pain, vaginal bleeding, vaginal discharge and vaginal pain.  Musculoskeletal: Negative for arthralgias, myalgias, neck pain and neck stiffness.  Skin: Negative for rash.  Neurological: Positive for weakness. Negative for dizziness and headaches.  All other systems reviewed and are negative.    Physical Exam Updated Vital  Signs BP 113/82   Pulse 83   Temp 98.5 F (36.9 C) (Oral)   Resp 16   Ht 5\' 3"  (1.6 m)   Wt 58.1 kg (128 lb)   LMP 01/25/2018 (Within Days) Comment: no longer pregnant per pt   SpO2 98%   Breastfeeding? Unknown   BMI 22.67 kg/m   Physical Exam  Constitutional: She appears well-developed and well-nourished. No distress.  HENT:  Head: Normocephalic.  Eyes: Conjunctivae are normal.  Neck: Neck supple.  Cardiovascular: Normal rate, regular rhythm and normal heart sounds.  Pulmonary/Chest: Effort normal and breath sounds normal. No respiratory distress. She has no wheezes. She has no rales.  Abdominal:  Soft. Bowel sounds are normal. She exhibits no distension. There is tenderness. There is no rebound.  RLQ and LLQ tenderness  Musculoskeletal: She exhibits no edema.  Neurological: She is alert.  Skin: Skin is warm and dry.  Psychiatric: She has a normal mood and affect. Her behavior is normal.  Nursing note and vitals reviewed.    ED Treatments / Results  Labs (all labs ordered are listed, but only abnormal results are displayed) Labs Reviewed  COMPREHENSIVE METABOLIC PANEL - Abnormal; Notable for the following components:      Result Value   Total Protein 8.5 (*)    All other components within normal limits  URINALYSIS, ROUTINE W REFLEX MICROSCOPIC - Abnormal; Notable for the following components:   APPearance HAZY (*)    Specific Gravity, Urine 1.032 (*)    Ketones, ur 20 (*)    Protein, ur 30 (*)    Squamous Epithelial / LPF 6-30 (*)    All other components within normal limits  LIPASE, BLOOD  CBC  I-STAT BETA HCG BLOOD, ED ([facility], [facility], [facility] ONLY)    EKG  EKG Interpretation None       Radiology No results found.  Procedures Procedures (including critical care time)  Medications Ordered in ED Medications  sodium chloride 0.9 % bolus 1,000 mL (not administered)  sodium chloride 0.9 % bolus 1,000 mL (not administered)  ondansetron (ZOFRAN) injection 4 mg (not administered)  dicyclomine (BENTYL) injection 20 mg (not administered)     Initial Impression / Assessment and Plan / ED Course  I have reviewed the triage vital signs and the nursing notes.  Pertinent labs & imaging results that were available during my care of the patient were reviewed by me and considered in my medical decision making (see chart for details).     Pt in ED with lower abdominal pain, nausea, vomiting. Few episodes of diarrhea, none in the last 10 hours.  Patient has been in the waiting room for 7 hours, states she still feels very bad, however she has not vomited.  She has not tried  drinking any fluids either.  On exam, patient does have some lower abdominal tenderness, however no guarding or rebound tenderness.  Will try IV fluids and Zofran, will reassess and reexamine abdomen after that.  8:46 AM  patient is feeling much better.  Abdominal exam repeated, nontender.  She is able to drink ginger ale without vomiting.  Most likely gastroenteritis.  Discussed plan of discharge home with antiemetics, continue oral fluids at home, follow-up as needed.  Return precautions discussed.  Vitals:   02/03/18 0557 02/03/18 0620 02/03/18 0630 02/03/18 0700  BP: 115/74 113/82 117/79 105/73  Pulse: 88 83 71 65  Resp: 16     Temp:      TempSrc:  SpO2: 99% 98% 95% 100%  Weight:      Height:         Final Clinical Impressions(s) / ED Diagnoses   Final diagnoses:  Nausea vomiting and diarrhea    ED Discharge Orders        Ordered    ondansetron (ZOFRAN ODT) 8 MG disintegrating tablet  Every 8 hours PRN     02/03/18 0847       Jaynie CrumbleKirichenko, Kaidynce Pfister, PA-C 02/03/18 [id number]    Azalia Bilisampos, Kevin, MD 02/03/18 1010

## 2018-02-03 NOTE — Discharge Instructions (Signed)
Zofran as prescribed as needed for nausea and vomiting. Advance your diet as tolerate. Follow up with family doctor as needed. Return if worsening symptoms.

## 2018-12-23 ENCOUNTER — Inpatient Hospital Stay
Admit: 2018-12-23 | Discharge: 2018-12-23 | Disposition: A | Discharge status: Home / Self Care | Payer: PRIVATE HEALTH INSURANCE | Attending: Obstetrics & Gynecology

## 2018-12-23 ENCOUNTER — Ambulatory Visit
Admit: 2018-12-23 | Payer: PRIVATE HEALTH INSURANCE | Primary: Student in an Organized Health Care Education/Training Program

## 2018-12-23 DIAGNOSIS — O26852 Spotting complicating pregnancy, second trimester: Secondary | ICD-10-CM

## 2018-12-23 LAB — CBC WITH AUTOMATED DIFF
ABS. BASOPHILS: 0.1 10*3/uL (ref 0.0–0.1)
ABS. EOSINOPHILS: 0.1 10*3/uL (ref 0.0–0.4)
ABS. IMM. GRANS.: 0 10*3/uL (ref 0.00–0.04)
ABS. LYMPHOCYTES: 1.8 10*3/uL (ref 0.8–3.5)
ABS. MONOCYTES: 0.7 10*3/uL (ref 0.0–1.0)
ABS. NEUTROPHILS: 8.7 10*3/uL — ABNORMAL HIGH (ref 1.8–8.0)
ABSOLUTE NRBC: 0 10*3/uL (ref 0.00–0.01)
BASOPHILS: 1 % (ref 0–1)
EOSINOPHILS: 1 % (ref 0–7)
HCT: 30.9 % — ABNORMAL LOW (ref 35.0–47.0)
HGB: 10.5 g/dL — ABNORMAL LOW (ref 11.5–16.0)
IMMATURE GRANULOCYTES: 0 % (ref 0.0–0.5)
LYMPHOCYTES: 16 % (ref 12–49)
MCH: 31.8 PG (ref 26.0–34.0)
MCHC: 34 g/dL (ref 30.0–36.5)
MCV: 93.6 FL (ref 80.0–99.0)
MONOCYTES: 6 % (ref 5–13)
MPV: 10 FL (ref 8.9–12.9)
NEUTROPHILS: 76 % — ABNORMAL HIGH (ref 32–75)
NRBC: 0 PER 100 WBC
PLATELET: 231 10*3/uL (ref 150–400)
RBC: 3.3 M/uL — ABNORMAL LOW (ref 3.80–5.20)
RDW: 13.3 % (ref 11.5–14.5)
WBC: 11.4 10*3/uL — ABNORMAL HIGH (ref 3.6–11.0)

## 2018-12-23 LAB — METABOLIC PANEL, COMPREHENSIVE
A-G Ratio: 0.8 — ABNORMAL LOW (ref 1.1–2.2)
ALT (SGPT): 13 U/L (ref 12–78)
AST (SGOT): 10 U/L — ABNORMAL LOW (ref 15–37)
Albumin: 3.2 g/dL — ABNORMAL LOW (ref 3.5–5.0)
Alk. phosphatase: 49 U/L (ref 45–117)
Anion gap: 6 mmol/L (ref 5–15)
BUN/Creatinine ratio: 10 — ABNORMAL LOW (ref 12–20)
BUN: 6 MG/DL (ref 6–20)
Bilirubin, total: 1.1 MG/DL — ABNORMAL HIGH (ref 0.2–1.0)
CO2: 24 mmol/L (ref 21–32)
Calcium: 9 MG/DL (ref 8.5–10.1)
Chloride: 107 mmol/L (ref 97–108)
Creatinine: 0.59 MG/DL (ref 0.55–1.02)
GFR est AA: >60 mL/min/{1.73_m2} (ref >60)
GFR est non-AA: >60 mL/min/{1.73_m2} (ref >60)
Globulin: 4.1 g/dL — ABNORMAL HIGH (ref 2.0–4.0)
Glucose: 64 mg/dL — ABNORMAL LOW (ref 65–100)
Potassium: 3.7 mmol/L (ref 3.5–5.1)
Protein, total: 7.3 g/dL (ref 6.4–8.2)
Sodium: 137 mmol/L (ref 136–145)

## 2018-12-23 LAB — URINALYSIS W/ REFLEX CULTURE
Bilirubin, Urine: NEGATIVE
Bilirubin: NEGATIVE
Glucose, Ur: NEGATIVE mg/dL
Glucose: NEGATIVE mg/dL
Ketone: NEGATIVE mg/dL
Ketones, Urine: NEGATIVE mg/dL
Nitrite, Urine: NEGATIVE
Nitrites: NEGATIVE
Protein, UA: NEGATIVE mg/dL
Protein: NEGATIVE mg/dL
Specific Gravity, UA: 1.006 (ref 1.003–1.030)
Specific gravity: 1.006 (ref 1.003–1.030)
Urobilinogen, UA, POCT: 0.2 EU/dL (ref 0.2–1.0)
Urobilinogen: 0.2 EU/dL (ref 0.2–1.0)
pH (UA): 6.5 (ref 5.0–8.0)
pH, UA: 6.5 (ref 5.0–8.0)

## 2018-12-23 LAB — FETAL FIBRONECTIN
Fetal Fibronectin: NEGATIVE
Fetal fibronectin: NEGATIVE

## 2018-12-23 LAB — COMPREHENSIVE METABOLIC PANEL
ALT: 13 U/L (ref 12–78)
AST: 10 U/L — ABNORMAL LOW (ref 15–37)
Albumin/Globulin Ratio: 0.8 — ABNORMAL LOW (ref 1.1–2.2)
Albumin: 3.2 g/dL — ABNORMAL LOW (ref 3.5–5.0)
Alkaline Phosphatase: 49 U/L (ref 45–117)
Anion Gap: 6 mmol/L (ref 5–15)
BUN: 6 MG/DL (ref 6–20)
Bun/Cre Ratio: 10 — ABNORMAL LOW (ref 12–20)
CO2: 24 mmol/L (ref 21–32)
Calcium: 9 MG/DL (ref 8.5–10.1)
Chloride: 107 mmol/L (ref 97–108)
Creatinine: 0.59 MG/DL (ref 0.55–1.02)
EGFR IF NonAfrican American: >60 mL/min/{1.73_m2} (ref >60)
GFR African American: >60 mL/min/{1.73_m2} (ref >60)
Globulin: 4.1 g/dL — ABNORMAL HIGH (ref 2.0–4.0)
Glucose: 64 mg/dL — ABNORMAL LOW (ref 65–100)
Potassium: 3.7 mmol/L (ref 3.5–5.1)
Sodium: 137 mmol/L (ref 136–145)
Total Bilirubin: 1.1 MG/DL — ABNORMAL HIGH (ref 0.2–1.0)
Total Protein: 7.3 g/dL (ref 6.4–8.2)

## 2018-12-23 LAB — CBC WITH AUTO DIFFERENTIAL
Basophils %: 1 % (ref 0–1)
Basophils Absolute: 0.1 10*3/uL (ref 0.0–0.1)
Eosinophils %: 1 % (ref 0–7)
Eosinophils Absolute: 0.1 10*3/uL (ref 0.0–0.4)
Granulocyte Absolute Count: 0 10*3/uL (ref 0.00–0.04)
Hematocrit: 30.9 % — ABNORMAL LOW (ref 35.0–47.0)
Hemoglobin: 10.5 g/dL — ABNORMAL LOW (ref 11.5–16.0)
Immature Granulocytes: 0 % (ref 0.0–0.5)
Lymphocytes %: 16 % (ref 12–49)
Lymphocytes Absolute: 1.8 10*3/uL (ref 0.8–3.5)
MCH: 31.8 PG (ref 26.0–34.0)
MCHC: 34 g/dL (ref 30.0–36.5)
MCV: 93.6 FL (ref 80.0–99.0)
MPV: 10 FL (ref 8.9–12.9)
Monocytes %: 6 % (ref 5–13)
Monocytes Absolute: 0.7 10*3/uL (ref 0.0–1.0)
NRBC Absolute: 0 10*3/uL (ref 0.00–0.01)
Neutrophils %: 76 % — ABNORMAL HIGH (ref 32–75)
Neutrophils Absolute: 8.7 10*3/uL — ABNORMAL HIGH (ref 1.8–8.0)
Nucleated RBCs: 0 PER 100 WBC
Platelets: 231 10*3/uL (ref 150–400)
RBC: 3.3 M/uL — ABNORMAL LOW (ref 3.80–5.20)
RDW: 13.3 % (ref 11.5–14.5)
WBC: 11.4 10*3/uL — ABNORMAL HIGH (ref 3.6–11.0)

## 2018-12-23 MED ORDER — LACTATED RINGERS IV
INTRAVENOUS | Status: DC
Start: 2018-12-23 — End: 2018-12-24
  Administered 2018-12-23 – 2018-12-24 (×3): via INTRAVENOUS

## 2018-12-23 MED ORDER — HYDROMORPHONE 2 MG TAB
2 mg | Freq: Four times a day (QID) | ORAL | Status: DC | PRN
Start: 2018-12-23 — End: 2018-12-24

## 2018-12-23 MED ORDER — SODIUM CHLORIDE 0.9 % IJ SYRG
INTRAMUSCULAR | Status: DC | PRN
Start: 2018-12-23 — End: 2018-12-24

## 2018-12-23 MED ORDER — ACETAMINOPHEN 325 MG TABLET
325 mg | ORAL | Status: DC | PRN
Start: 2018-12-23 — End: 2018-12-24
  Administered 2018-12-23: 20:00:00 via ORAL

## 2018-12-23 MED ORDER — SODIUM CHLORIDE 0.9 % IJ SYRG
Freq: Three times a day (TID) | INTRAMUSCULAR | Status: DC
Start: 2018-12-23 — End: 2018-12-24
  Administered 2018-12-23: 19:00:00 via INTRAVENOUS

## 2018-12-23 MED ORDER — OXYCODONE 5 MG TAB
5 mg | Freq: Four times a day (QID) | ORAL | Status: DC | PRN
Start: 2018-12-23 — End: 2018-12-23

## 2018-12-23 MED ORDER — LACTATED RINGERS BOLUS IV
Freq: Once | INTRAVENOUS | Status: AC
Start: 2018-12-23 — End: 2018-12-23
  Administered 2018-12-23: 16:00:00 via INTRAVENOUS

## 2018-12-23 MED ORDER — FAMOTIDINE 20 MG TAB
20 mg | Freq: Two times a day (BID) | ORAL | Status: DC | PRN
Start: 2018-12-23 — End: 2018-12-24

## 2018-12-23 MED ORDER — ADV ADDAPTOR
2 gram | Freq: Two times a day (BID) | Status: DC
Start: 2018-12-23 — End: 2018-12-24
  Administered 2018-12-23 – 2018-12-24 (×2): via INTRAVENOUS

## 2018-12-23 MED ORDER — PRENATAL VIT-IRON FUMARATE-FA 28 MG-0.8 MG TABLET
28 mg iron- 800 mcg | Freq: Every day | ORAL | Status: DC
Start: 2018-12-23 — End: 2018-12-24
  Administered 2018-12-24: 14:00:00 via ORAL

## 2018-12-23 MED ORDER — CEFEPIME 1 GRAM SOLUTION FOR INJECTION
1 gram | Freq: Two times a day (BID) | INTRAMUSCULAR | Status: DC
Start: 2018-12-23 — End: 2018-12-23

## 2018-12-23 MED ORDER — ONDANSETRON 4 MG TAB, RAPID DISSOLVE
4 mg | Freq: Three times a day (TID) | ORAL | Status: DC | PRN
Start: 2018-12-23 — End: 2018-12-24

## 2018-12-23 MED ORDER — DOCUSATE SODIUM 100 MG CAP
100 mg | Freq: Two times a day (BID) | ORAL | Status: DC | PRN
Start: 2018-12-23 — End: 2018-12-24

## 2018-12-23 MED ORDER — DIPHENHYDRAMINE 25 MG CAP
25 mg | ORAL | Status: DC | PRN
Start: 2018-12-23 — End: 2018-12-24

## 2018-12-23 MED FILL — CEFEPIME 2 GRAM SOLUTION FOR INJECTION: 2 gram | INTRAMUSCULAR | Qty: 2

## 2018-12-23 MED FILL — LACTATED RINGERS IV: INTRAVENOUS | Qty: 1000

## 2018-12-23 MED FILL — MAPAP (ACETAMINOPHEN) 325 MG TABLET: 325 mg | ORAL | Qty: 2

## 2018-12-23 NOTE — Progress Notes (Signed)
28- dr. Petra Kuba and dr. Marla Roe to bedside. poc discussed. Pt will get ultrasound of kidneys and decide whether to stay or be discharged    0925- pt sts wants to stay in hospital, pt  Transferred to rm 78. Attempted iv, blew then ultrasound in to take pt. Will do IV when pt returns    1530- assumed care of pt    1922- sbar report to t.jackson,rn

## 2018-12-23 NOTE — Progress Notes (Signed)
 Pharmacy Automatic Renal Dosing Protocol - Antimicrobials    Indication for Antimicrobials: UTI vs pyelonephritis    Current Regimen of Each Antimicrobial:   Cefepime 1g IV q12 ; start 2/6; day 1    Previous Antimicrobial Therapy:   N/a      Significant Cultures:   2/6 urine cx pending    Radiology / Imaging results: (X-ray, CT scan or MRI):   2/6 renal US : Mild right hydronephrosis. Otherwise normal.     Paralysis, amputations, malnutrition: none noted    Labs:  Recent Labs     12/23/18  1045   WBC 11.4*     Temp (24hrs), Avg:98 F (36.7 C), Min:97.6 F (36.4 C), Max:98.2 F (36.8 C)    Creatinine Clearance (mL/min) or Dialysis: Scr not checked, presume >100 mL/min    Impression/Plan:   Increase cefepime to 2g IV q12h, possible concern for pyelonephritis per MD notes  Antimicrobial stop date 3 days, but may need a longer course if true pyelonephritis     Pharmacy will follow daily and adjust medications as appropriate for renal function and/or serum levels.    Thank you,  Alan Chimera, [facility]

## 2018-12-23 NOTE — ED Notes (Signed)
Patient visiting from [place].  Noticed bleeding when she went to the bathroom this morning.  Back pain since yesterday.  [redacted] weeks pregnant.

## 2018-12-23 NOTE — Progress Notes (Signed)
Report received from A. Ladona Ridgel, Rn, pt is not on unit at this time. In ultrasound    1036 pt back from ultra sound, will get labs and iv started, sunquest is not working in room, will print lab orders and label    1045 Iv placed in left forearm, labs drawn labeled and verified name dob with patient, orders printed and sent with labs, up to bathroom to void 150 ml, strained.'    1100 IV infusing at this time, connect care down in the room, unable to scan fluids.    1130 Called Dr Marla Roe to get diet order, ultrasound results back as well  1215 Bolus completed, continuous infusion started with antibiotics, side effects of antibiotics discussed.  1345 pt resting comfortably with Kpad to right flank, pain is 4/10 per pt.  1358 allergies per pt added to chart  1500 Report to A. Ladona Ridgel RN

## 2018-12-23 NOTE — Progress Notes (Signed)
0600: Pt arrives via ED. G1 23w. C/o back pain and spotting. Pt reports no LOF, positive fetal movement.     3299: Dr. Petra Kuba at bedside to assess pt. Verbal orders received to monitor ctx, collect urinalysis and FFN.    0715: Bedside shift change report given to A. Ladona Ridgel, RN by A. Lonni Fix, RN. Care turned over at this time.

## 2018-12-23 NOTE — H&P (Signed)
History & Physical    Name: Kaitlyn Fischer MRN: [id number]  SSN: [id number]    Date of Birth: 10-25-1991  Age: 28 y.o.  Sex: female        Subjective:     Estimated Date of Delivery: None noted.  OB History   Gravida Para Term Preterm AB Living   1             SAB TAB Ectopic Molar Multiple Live Births                    # Outcome Date GA Lbr Len/2nd Weight Sex Delivery Anes PTL Lv   1 Current                Kaitlyn Fischer presents with pregnancy at 23 weeks for c/0 r sided back pain for the last 48 hours that is moderate in nature,non radiating,waxing and waning now with associated vaginal spotting of blood noted with wiping after voiding. Pt also c/o end void mild dysuria and increased frequency, Pt denies decreased FM,ROM,abdominal pain, or contractions... Prenatal course is complicated by care being obtained in [place] with no records available but otherwise uncomplicated. Please see prenatal records for details.    Past Medical History:   Diagnosis Date   ??? Second hand smoke exposure      No past surgical history on file. No PSH  Social History     Occupational History   ??? Not on file   Tobacco Use   ??? Smoking status: Not on file   Substance and Sexual Activity   ??? Alcohol use: Not on file   ??? Drug use: Not on file   ??? Sexual activity: Not on file   No smoking,ETOH, or drugs.  No family history on file. Family History is non contributory. Specifically no h/o Kidney stones  No Known Allergies  Prior to Admission medications    Medication Sig Start Date End Date Taking? Authorizing Provider   ibuprofen (MOTRIN) 600 mg tablet Take 1 Tab by mouth every six (6) hours as needed for Pain. 02/04/10   Colfer, Virgia Land, MD        Review of Systems   Constitutional: Negative for chills, diaphoresis and fever.   HENT: Negative for congestion, postnasal drip, rhinorrhea and sore throat.    Eyes: Negative for photophobia and visual disturbance.   Respiratory: Negative for cough, shortness of breath and wheezing.    Cardiovascular:  Negative for chest pain, palpitations and leg swelling.   Gastrointestinal: Negative for abdominal pain, constipation, diarrhea, nausea and vomiting.   Endocrine: Negative for cold intolerance and heat intolerance.   Genitourinary: Positive for frequency, urgency and vaginal bleeding. Negative for flank pain, genital sores, hematuria, pelvic pain and vaginal discharge.   Musculoskeletal: Positive for back pain. Negative for arthralgias, myalgias and neck stiffness.   Skin: Negative for color change, pallor and rash.   Neurological: Negative for dizziness, light-headedness and headaches.   Hematological: Negative for adenopathy. Does not bruise/bleed easily.   Psychiatric/Behavioral: Negative for agitation, behavioral problems, confusion and decreased concentration.       Objective:     Vitals:  Vitals:    12/23/18 0537 12/23/18 0623   BP: 117/72 110/53   Pulse: 76 80   Resp: 16 18   Temp: 97.6 ??F (36.4 ??C) 98.2 ??F (36.8 ??C)   SpO2: 100% 100%   Weight: 61.3 kg (135 lb 2.3 oz)    Height: 5\' 3"  (1.6 m)  Physical Exam  Vitals signs and nursing note reviewed. Exam conducted with a chaperone present.   Constitutional:       General: She is not in acute distress.     Appearance: Normal appearance. She is normal weight. She is not ill-appearing, toxic-appearing or diaphoretic.   HENT:      Head: Normocephalic and atraumatic.      Right Ear: External ear normal.      Left Ear: External ear normal.   Eyes:      General: No scleral icterus.        Right eye: No discharge.         Left eye: No discharge.      Extraocular Movements: Extraocular movements intact.   Neck:      Musculoskeletal: Normal range of motion and neck supple. No neck rigidity or muscular tenderness.   Cardiovascular:      Rate and Rhythm: Normal rate and regular rhythm.      Heart sounds: Normal heart sounds. No murmur. No friction rub. No gallop.    Pulmonary:      Effort: Pulmonary effort is normal. No respiratory distress.      Breath sounds:  Normal breath sounds. No stridor. No wheezing, rhonchi or rales.   Abdominal:      General: Bowel sounds are normal. There is no distension.      Palpations: Abdomen is soft. There is no mass.      Tenderness: There is no abdominal tenderness. There is right CVA tenderness. There is no left CVA tenderness, guarding or rebound.      Hernia: No hernia is present.   Genitourinary:     General: Normal vulva.   Musculoskeletal:         General: No swelling, tenderness, deformity or signs of injury.      Right lower leg: No edema.      Left lower leg: No edema.   Lymphadenopathy:      Cervical: No cervical adenopathy.   Skin:     General: Skin is warm and dry.      Coloration: Skin is not jaundiced or pale.      Findings: No bruising, erythema, lesion or rash.   Neurological:      Mental Status: She is alert and oriented to person, place, and time.      Sensory: No sensory deficit.      Motor: No weakness.      Coordination: Coordination normal.      Gait: Gait normal.      Deep Tendon Reflexes: Reflexes normal.   Psychiatric:         Mood and Affect: Mood normal.         Behavior: Behavior normal.         Thought Content: Thought content normal.         Judgment: Judgment normal.         Cervical Exam: Closed/Thick/High  Uterine Activity: None  Membranes: Intact  Fetal Heart Rate: Baseline: 130 per minute      Ultrasound: Transverse with head to the right. Anterior placenta with no previa or retroplacental lucencies.    I personally preformed the ultrasound  And interp the results.    Prenatal Labs:   No results found for: ABORH, RUBELLAEXT, GRBSEXT, HBSAGEXT, HIVEXT, RPREXT, GONNOEXT, CHLAMEXT, ABORHEXT, RUBELLAEXT, GRBSEXT, HBSAGEXT, HIVEXT, RPREXT, GONNOEXT, CHLAMEXT       Impression/Plan:   1) R/O Pyelo v Kidney stone. U/A reviewed and appears more  c/w renal lithiasis. FFN neg.       Plan: Renal ultrasound to check for stone/hydro. Await urine C&S. Plan per result but discussed with pt possibility of in pt vs out pt  management pending those results. Case discussed with Dr. Marla Roe.

## 2018-12-23 NOTE — Progress Notes (Signed)
0730 Report received from A. Lonni Fix     [phone] Dr Petra Kuba at bedside. FFN done.    0840 Pt to U/S via stretcher.    1500 Report received from R. Pharmacist, hospital.    1515 Pt resting quietly with family at bedside. VSS. BP low but asymptomatic. FHR 130 with doppler. Denies pain except for mild headache. Tylenol given. No other issues at this time.    1524 Writer asked to circulate surgery for another nurse. Report to [employer]

## 2018-12-23 NOTE — ED Provider Notes (Signed)
ED Provider Notes by Mellody LifeKnorr, Ruchy Wildrick R, MD at 12/23/18 [phone]                Author: Mellody LifeKnorr, Daleiza Bacchi R, MD  Service: Emergency Medicine  Author Type: Physician       Filed: 12/23/18 0541  Date of Service: 12/23/18 0536  Status: Signed          Editor: Mellody LifeKnorr, Ambree Frances R, MD (Physician)               EMERGENCY DEPARTMENT HISTORY AND PHYSICAL EXAM        ----------------------------------------------------------------------------------------   Please note that this dictation was completed with Dragon, the computer voice recognition software.  Quite often unanticipated grammatical, syntax, homophones, and other interpretive  errors are inadvertently transcribed by the computer software.  Please disregard these errors.  Please excuse any errors that have escaped final proofreading.   ---------------------------------------------------------------------------------------         Date: (Not on file)   Patient Name: Kaitlyn Fischer        History of Presenting Illness          Chief Complaint       Patient presents with        ?  Pregnancy Problem           History Provided By: Patient      RUE:[id number]:Kaitlyn Fischer is a  28 y.o. female, G1 23weeks, followed by [place] in [place]renatal care, who presents ambulatory to the ED with c/o vaginal spotting that started early this morning.  Patient notes associated  aching bilateral lower back pain.  Denies recent heavy lifting, sexual activity for 3 days.  Notes that she was standing a lot yesterday as she is up here for her grandmother's funeral.  No other complications during her pregnancy thus far.  Denies other  past medical history or daily medications.  Pt specifically denies any  loss of fluid, fever, congestion, chest pain, shortness of breath, nausea, vomiting, diarrhea, dysuria, or urinary frequency.      Social Hx: Denies tobacco, Denies EtOH, Denies Illicit Drugs       There are no other complaints, changes, or physical findings at this time.         Current  Outpatient Medications          Medication  Sig  Dispense  Refill           ?  ibuprofen (MOTRIN) 600 mg tablet  Take 1 Tab by mouth every six (6) hours as needed for Pain.  20 Tab  0             Past History        Past Medical History:     Past Medical History:        Diagnosis  Date         ?  Second hand smoke exposure             Past Surgical History:   No past surgical history on file.      Family History:   No family history on file.      Social History:     Social History          Tobacco Use         ?  Smoking status:  Not on file       Substance Use Topics         ?  Alcohol use:  Not on file         ?  Drug use:  Not on file           Allergies:   No Known Allergies           Review of Systems     Review of Systems    Constitutional: Negative.  Negative for fever.    Eyes: Negative.     Respiratory: Negative.  Negative for shortness of breath.     Cardiovascular: Negative for chest pain.    Gastrointestinal: Negative for abdominal pain, nausea and vomiting.    Endocrine: Negative.     Genitourinary: Positive for vaginal bleeding. Negative for difficulty urinating, dysuria and hematuria.    Musculoskeletal: Positive for back pain.    Skin: Negative.     Neurological: Negative.     Psychiatric/Behavioral: Negative for suicidal ideas.    All other systems reviewed and are negative.           Physical Exam     Physical Exam   Vitals signs and nursing note reviewed.   Constitutional:        General: She is not in acute distress.     Appearance: She is well-developed. She is not diaphoretic.    HENT:       Head: Normocephalic and atraumatic.      Nose: Nose normal.    Eyes:       General: No scleral icterus.     Conjunctiva/sclera: Conjunctivae normal.    Neck:       Musculoskeletal: Normal range of motion.      Trachea: No tracheal deviation.    Cardiovascular:       Rate and Rhythm: Normal rate and regular rhythm.      Heart sounds: Normal heart sounds. No murmur. No friction rub.    Pulmonary:       Effort:  Pulmonary effort is normal. No respiratory distress.      Breath sounds: Normal breath sounds. No stridor. No wheezing or  rales.    Abdominal:      General: Bowel sounds are normal. There is no distension.      Palpations: Abdomen is soft.      Tenderness: There is no abdominal tenderness. There is no rebound.     Musculoskeletal: Normal range of motion.          General: No tenderness.      Lumbar back: She exhibits pain.        Back:       Skin:      General: Skin is warm and dry.      Findings: No rash.    Neurological:       Mental Status: She is alert and oriented to person, place, and time.      Cranial Nerves: No cranial nerve deficit.    Psychiatric:         Speech: Speech normal.         Behavior: Behavior normal.         Thought Content: Thought content normal.         Judgment: Judgment normal.                        Medical Decision Making     I am the first provider for this patient.      I reviewed the vital signs, available nursing notes, past medical history, past surgical history, family history and social history.      Vital Signs-Reviewed the patient's vital  signs.   Patient Vitals for the past 12 hrs:            Temp  Pulse  Resp  BP  SpO2            12/23/18 0537  97.6 ??F (36.4 ??C)  76  16  117/72  100 %           Pulse Oximetry Analysis - 100% on RA        Records Reviewed: Nursing Notes      Provider Notes (Medical Decision Making):    DDX:   Active labor, false labor, uti, abdominal pain in pregnancy      Plan:   Send to L&D for further eval      Impression:   Lower back pain and spotting in pregnancy         ED Course:    Initial assessment performed. The patients presenting problems have been discussed, and they are in agreement with the care plan formulated and outlined with them.  I have encouraged them to ask  questions as they arise throughout their visit.      I reviewed our electronic medical record system for any past medical records that were available that may contribute to the  patients current condition, the nursing notes and and vital signs from today's  visit      Nursing notes will be reviewed as they become available in realtime while the pt has been in the ED.   Mellody Life, MD      5:40 AM   Pt without evidence of acute, non-obstetric emergency at this time, will send to L&D for further evaluation and work up.  Should pt have continued sx but found to be without active labor, recommend to return to ED.   Mellody Life, MD         Disposition:      PLAN:   1. Admit to L&D.      Admit Note:   5:40 AM   Pt is being transported to L&D for further work up and evaluation. They have convey agreement and understanding of plan  Nursing has notified L&D staff of pt's imminent arrival.        Diagnosis        Clinical Impression: Lower back pain spotting in second trimester              This note will not be viewable in MyChart.

## 2018-12-23 NOTE — Progress Notes (Signed)
0733-FFN collected by Dr. Petra Kuba    0740-FFN walked down to lab by OBT

## 2018-12-23 NOTE — Progress Notes (Addendum)
0600: Pt arrives via ED. G1 23w. C/o back pain and spotting. Pt reports no LOF, positive fetal movement.     0640: Dr. Vogel at bedside to assess pt. Verbal orders received to monitor ctx, collect urinalysis and FFN.    0715: Bedside shift change report given to A. Taylor, RN by A. Nolan, RN. Care turned over at this time.

## 2018-12-23 NOTE — Progress Notes (Addendum)
0730 Report received from A. Nolan     0733 Dr Vogel at bedside. FFN done.    0840 Pt to U/S via stretcher.    1500 Report received from R. Custolow RN.    1515 Pt resting quietly with family at bedside. VSS. BP low but asymptomatic. FHR 130 with doppler. Denies pain except for mild headache. Tylenol given. No other issues at this time.    1524 Writer asked to circulate surgery for another nurse. Report to C Schools RN

## 2018-12-23 NOTE — ED Provider Notes (Signed)
EMERGENCY DEPARTMENT HISTORY AND PHYSICAL EXAM    ----------------------------------------------------------------------------------------  Please note that this dictation was completed with Dragon, the computer voice recognition software.  Quite often unanticipated grammatical, syntax, homophones, and other interpretive errors are inadvertently transcribed by the computer software.  Please disregard these errors.  Please excuse any errors that have escaped final proofreading.  ---------------------------------------------------------------------------------------      Date: (Not on file)  Patient Name: Kaitlyn Fischer    History of Presenting Illness     Chief Complaint   Patient presents with   ??? Pregnancy Problem       History Provided By: Patient    ZOX:[id number]:Aldeen Judithann GravesFarrar is a 28 y.o. female, G1 23weeks, followed [place]GYN in [place] for [place]renatal care, who presents ambulatory to the ED with c/o vaginal spotting that started early this morning.  Patient notes associated aching bilateral lower back pain.  Denies recent heavy lifting, sexual activity for 3 days.  Notes that she was standing a lot yesterday as she is up here for her grandmother's funeral.  No other complications during her pregnancy thus far.  Denies other past medical history or daily medications.  Pt specifically denies any  loss of fluid, fever, congestion, chest pain, shortness of breath, nausea, vomiting, diarrhea, dysuria, or urinary frequency.    Social Hx: Denies tobacco, Denies EtOH, Denies Illicit Drugs     There are no other complaints, changes, or physical findings at this time.     Current Outpatient Medications   Medication Sig Dispense Refill   ??? ibuprofen (MOTRIN) 600 mg tablet Take 1 Tab by mouth every six (6) hours as needed for Pain. 20 Tab 0       Past History     Past Medical History:  Past Medical History:   Diagnosis Date   ??? Second hand smoke exposure        Past Surgical History:  No past surgical history on file.     Family History:  No family history on file.    Social History:  Social History     Tobacco Use   ??? Smoking status: Not on file   Substance Use Topics   ??? Alcohol use: Not on file   ??? Drug use: Not on file       Allergies:  No Known Allergies      Review of Systems   Review of Systems   Constitutional: Negative.  Negative for fever.   Eyes: Negative.    Respiratory: Negative.  Negative for shortness of breath.    Cardiovascular: Negative for chest pain.   Gastrointestinal: Negative for abdominal pain, nausea and vomiting.   Endocrine: Negative.    Genitourinary: Positive for vaginal bleeding. Negative for difficulty urinating, dysuria and hematuria.   Musculoskeletal: Positive for back pain.   Skin: Negative.    Neurological: Negative.    Psychiatric/Behavioral: Negative for suicidal ideas.   All other systems reviewed and are negative.      Physical Exam   Physical Exam  Vitals signs and nursing note reviewed.   Constitutional:       General: She is not in acute distress.     Appearance: She is well-developed. She is not diaphoretic.   HENT:      Head: Normocephalic and atraumatic.      Nose: Nose normal.   Eyes:      General: No scleral icterus.     Conjunctiva/sclera: Conjunctivae normal.   Neck:      Musculoskeletal: Normal  range of motion.      Trachea: No tracheal deviation.   Cardiovascular:      Rate and Rhythm: Normal rate and regular rhythm.      Heart sounds: Normal heart sounds. No murmur. No friction rub.   Pulmonary:      Effort: Pulmonary effort is normal. No respiratory distress.      Breath sounds: Normal breath sounds. No stridor. No wheezing or rales.   Abdominal:      General: Bowel sounds are normal. There is no distension.      Palpations: Abdomen is soft.      Tenderness: There is no abdominal tenderness. There is no rebound.   Musculoskeletal: Normal range of motion.         General: No tenderness.      Lumbar back: She exhibits pain.        Back:    Skin:     General: Skin is warm and dry.       Findings: No rash.   Neurological:      Mental Status: She is alert and oriented to person, place, and time.      Cranial Nerves: No cranial nerve deficit.   Psychiatric:         Speech: Speech normal.         Behavior: Behavior normal.         Thought Content: Thought content normal.         Judgment: Judgment normal.               Medical Decision Making   I am the first provider for this patient.    I reviewed the vital signs, available nursing notes, past medical history, past surgical history, family history and social history.    Vital Signs-Reviewed the patient's vital signs.  Patient Vitals for the past 12 hrs:   Temp Pulse Resp BP SpO2   12/23/18 0537 97.6 ??F (36.4 ??C) 76 16 117/72 100 %       Pulse Oximetry Analysis - 100% on RA      Records Reviewed: Nursing Notes    Provider Notes (Medical Decision Making):   DDX:  Active labor, false labor, uti, abdominal pain in pregnancy    Plan:  Send to L&D for further eval    Impression:  Lower back pain and spotting in pregnancy      ED Course:   Initial assessment performed. The patients presenting problems have been discussed, and they are in agreement with the care plan formulated and outlined with them.  I have encouraged them to ask questions as they arise throughout their visit.    I reviewed our electronic medical record system for any past medical records that were available that may contribute to the patients current condition, the nursing notes and and vital signs from today's visit    Nursing notes will be reviewed as they become available in realtime while the pt has been in the ED.  Mellody Life, MD    5:40 AM  Pt without evidence of acute, non-obstetric emergency at this time, will send to L&D for further evaluation and work up.  Should pt have continued sx but found to be without active labor, recommend to return to ED.  Mellody Life, MD      Disposition:    PLAN:  1. Admit to L&D.    Admit Note:  5:40 AM   Pt is being transported to L&D for further work  up and evaluation. They have convey agreement and understanding of plan  Nursing has notified L&D staff of pt's imminent arrival.    Diagnosis     Clinical Impression: Lower back pain spotting in second trimester        This note will not be viewable in MyChart.

## 2018-12-23 NOTE — Progress Notes (Addendum)
0733-FFN collected by Dr. Vogel    0740-FFN walked down to lab by OBT

## 2018-12-23 NOTE — Progress Notes (Signed)
Pharmacy Automatic Renal Dosing Protocol - Antimicrobials    Indication for Antimicrobials: UTI vs pyelonephritis    Current Regimen of Each Antimicrobial:   Cefepime 1g IV q12 ; start 2/6; day 1    Previous Antimicrobial Therapy:   N/a      Significant Cultures:   2/6 urine cx pending    Radiology / Imaging results: (X-ray, CT scan or MRI):   2/6 renal US: Mild right hydronephrosis. Otherwise normal.     Paralysis, amputations, malnutrition: none noted    Labs:  Recent Labs     12/23/18  1045   WBC 11.4*     Temp (24hrs), Avg:98 ??F (36.7 ??C), Min:97.6 ??F (36.4 ??C), Max:98.2 ??F (36.8 ??C)    Creatinine Clearance (mL/min) or Dialysis: Scr not checked, presume >100 mL/min    Impression/Plan:   Increase cefepime to 2g IV q12h, possible concern for pyelonephritis per MD notes  Antimicrobial stop date 3 days, but may need a longer course if true pyelonephritis     Pharmacy will follow daily and adjust medications as appropriate for renal function and/or serum levels.    Thank you,  Amanda Wiebe, PHARMD

## 2018-12-23 NOTE — Progress Notes (Addendum)
0845- dr. Vogel and dr. maute to bedside. poc discussed. Pt will get ultrasound of kidneys and decide whether to stay or be discharged    0925- pt sts wants to stay in hospital, pt  Transferred to rm 78. Attempted iv, blew then ultrasound in to take pt. Will do IV when pt returns    1530- assumed care of pt    1922- sbar report to t.jackson,rn

## 2018-12-23 NOTE — H&P (Addendum)
History & Physical    Name: Kaitlyn Fischer MRN: [id number]  SSN: [id number]    Date of Birth: May 02, 1991  Age: 28 y.o.  Sex: female        Subjective:     Estimated Date of Delivery: None noted.  OB History   Gravida Para Term Preterm AB Living   1             SAB TAB Ectopic Molar Multiple Live Births                    # Outcome Date GA Lbr Len/2nd Weight Sex Delivery Anes PTL Lv   1 Current                Kaitlyn Fischer presents with pregnancy at 23 weeks for c/0 r sided back pain for the last 48 hours that is moderate in nature,non radiating,waxing and waning now with associated vaginal spotting of blood noted with wiping after voiding. Pt also c/o end void mild dysuria and increased frequency, Pt denies decreased FM,ROM,abdominal pain, or contractions... Prenatal course is complicated by care being obtained in [place] with no records available but otherwise uncomplicated. Please see prenatal records for details.    Past Medical History:   Diagnosis Date   ??? Second hand smoke exposure      No past surgical history on file. No PSH  Social History     Occupational History   ??? Not on file   Tobacco Use   ??? Smoking status: Not on file   Substance and Sexual Activity   ??? Alcohol use: Not on file   ??? Drug use: Not on file   ??? Sexual activity: Not on file   No smoking,ETOH, or drugs.  No family history on file. Family History is non contributory. Specifically no h/o Kidney stones  No Known Allergies  Prior to Admission medications    Medication Sig Start Date End Date Taking? Authorizing Provider   ibuprofen (MOTRIN) 600 mg tablet Take 1 Tab by mouth every six (6) hours as needed for Pain. 02/04/10   Colfer, Virgia Land, MD        Review of Systems   Constitutional: Negative for chills, diaphoresis and fever.   HENT: Negative for congestion, postnasal drip, rhinorrhea and sore throat.    Eyes: Negative for photophobia and visual disturbance.   Respiratory: Negative for cough, shortness of breath and wheezing.     Cardiovascular: Negative for chest pain, palpitations and leg swelling.   Gastrointestinal: Negative for abdominal pain, constipation, diarrhea, nausea and vomiting.   Endocrine: Negative for cold intolerance and heat intolerance.   Genitourinary: Positive for frequency, urgency and vaginal bleeding. Negative for flank pain, genital sores, hematuria, pelvic pain and vaginal discharge.   Musculoskeletal: Positive for back pain. Negative for arthralgias, myalgias and neck stiffness.   Skin: Negative for color change, pallor and rash.   Neurological: Negative for dizziness, light-headedness and headaches.   Hematological: Negative for adenopathy. Does not bruise/bleed easily.   Psychiatric/Behavioral: Negative for agitation, behavioral problems, confusion and decreased concentration.       Objective:     Vitals:  Vitals:    12/23/18 0537 12/23/18 0623   BP: 117/72 110/53   Pulse: 76 80   Resp: 16 18   Temp: 97.6 ??F (36.4 ??C) 98.2 ??F (36.8 ??C)   SpO2: 100% 100%   Weight: 61.3 kg (135 lb 2.3 oz)    Height: 5\' 3"  (1.6 m)  Physical Exam  Vitals signs and nursing note reviewed. Exam conducted with a chaperone present.   Constitutional:       General: She is not in acute distress.     Appearance: Normal appearance. She is normal weight. She is not ill-appearing, toxic-appearing or diaphoretic.   HENT:      Head: Normocephalic and atraumatic.      Right Ear: External ear normal.      Left Ear: External ear normal.   Eyes:      General: No scleral icterus.        Right eye: No discharge.         Left eye: No discharge.      Extraocular Movements: Extraocular movements intact.   Neck:      Musculoskeletal: Normal range of motion and neck supple. No neck rigidity or muscular tenderness.   Cardiovascular:      Rate and Rhythm: Normal rate and regular rhythm.      Heart sounds: Normal heart sounds. No murmur. No friction rub. No gallop.    Pulmonary:      Effort: Pulmonary effort is normal. No respiratory distress.       Breath sounds: Normal breath sounds. No stridor. No wheezing, rhonchi or rales.   Abdominal:      General: Bowel sounds are normal. There is no distension.      Palpations: Abdomen is soft. There is no mass.      Tenderness: There is no abdominal tenderness. There is right CVA tenderness. There is no left CVA tenderness, guarding or rebound.      Hernia: No hernia is present.   Genitourinary:     General: Normal vulva.   Musculoskeletal:         General: No swelling, tenderness, deformity or signs of injury.      Right lower leg: No edema.      Left lower leg: No edema.   Lymphadenopathy:      Cervical: No cervical adenopathy.   Skin:     General: Skin is warm and dry.      Coloration: Skin is not jaundiced or pale.      Findings: No bruising, erythema, lesion or rash.   Neurological:      Mental Status: She is alert and oriented to person, place, and time.      Sensory: No sensory deficit.      Motor: No weakness.      Coordination: Coordination normal.      Gait: Gait normal.      Deep Tendon Reflexes: Reflexes normal.   Psychiatric:         Mood and Affect: Mood normal.         Behavior: Behavior normal.         Thought Content: Thought content normal.         Judgment: Judgment normal.         Cervical Exam: Closed/Thick/High  Uterine Activity: None  Membranes: Intact  Fetal Heart Rate: Baseline: 130 per minute      Ultrasound: Transverse with head to the right. Anterior placenta with no previa or retroplacental lucencies.    I personally preformed the ultrasound  And interp the results.    Prenatal Labs:   No results found for: ABORH, RUBELLAEXT, GRBSEXT, HBSAGEXT, HIVEXT, RPREXT, GONNOEXT, CHLAMEXT, ABORHEXT, RUBELLAEXT, GRBSEXT, HBSAGEXT, HIVEXT, RPREXT, GONNOEXT, CHLAMEXT       Impression/Plan:   1) R/O Pyelo v Kidney stone. U/A reviewed and appears more  c/w renal lithiasis. FFN neg.       Plan: Renal ultrasound to check for stone/hydro. Await urine C&S. Plan per  result but discussed with pt possibility of in pt vs out pt management pending those results. Case discussed with Dr. Marla Roe.

## 2018-12-23 NOTE — Progress Notes (Addendum)
Report received from A. Taylor, Rn, pt is not on unit at this time. In ultrasound    1036 pt back from ultra sound, will get labs and iv started, sunquest is not working in room, will print lab orders and label    1045 Iv placed in left forearm, labs drawn labeled and verified name dob with patient, orders printed and sent with labs, up to bathroom to void 150 ml, strained.'    1100 IV infusing at this time, connect care down in the room, unable to scan fluids.    1130 Called Dr Maute to get diet order, ultrasound results back as well  1215 Bolus completed, continuous infusion started with antibiotics, side effects of antibiotics discussed.  1345 pt resting comfortably with Kpad to right flank, pain is 4/10 per pt.  1358 allergies per pt added to chart  1500 Report to A. Taylor RN

## 2018-12-23 NOTE — ED Triage Notes (Signed)
Patient visiting from [place].  Noticed bleeding when she went to the bathroom this morning.  Back pain since yesterday.  [redacted] weeks pregnant.

## 2018-12-24 MED ORDER — NITROFURANTOIN (25% MACROCRYSTAL FORM) 100 MG CAP
100 mg | ORAL_CAPSULE | Freq: Two times a day (BID) | ORAL | 0 refills | Status: AC
Start: 2018-12-24 — End: 2019-01-03

## 2018-12-24 MED ORDER — NITROFURANTOIN (25% MACROCRYSTAL FORM) 100 MG CAP
100 mg | Freq: Two times a day (BID) | ORAL | Status: DC
Start: 2018-12-24 — End: 2018-12-24

## 2018-12-24 MED FILL — CEFEPIME 2 GRAM SOLUTION FOR INJECTION: 2 gram | INTRAMUSCULAR | Qty: 2

## 2018-12-24 MED FILL — PRENATAL VIT-IRON FUMARATE-FA 28 MG-0.8 MG TABLET: 28 mg iron- 800 mcg | ORAL | Qty: 1

## 2018-12-24 NOTE — Discharge Summary (Signed)
Antepartum  Discharge Summary     Patient ID:  Kaitlyn Fischer  [id number]  28 y.o.  Apr 14, 1991    Admit date: 12/23/2018    Discharge date: 12/29/2018    Admission Diagnoses:    Patient Active Problem List   Diagnosis Code   ??? Flank pain in pregnant patient O26.899, R10.9       Discharge Diagnoses: There are no discharge diagnoses documented for the most recent discharge.  Patient Active Problem List   Diagnosis Code   ??? Flank pain in pregnant patient O26.899, R10.9       Procedures for this admission: Renal US: mild right hydronephrosis    Hospital Course: Admitted at [redacted]w[redacted]d with right flank pain, afebrile - nephrolithiasis vs pyelonephritis. Remained afebrile. Symptomatically improved with IV hydration, PO tylenol. Discharged home in good condition at [redacted]w[redacted]d.     Disposition: home    Discharged Condition: good    Patient plans to return for changes in her condition or the condition of the baby or for delivery of the baby.    Patient Instructions:   Cannot display discharge medications since this patient is not currently admitted.    Activity: Activity as tolerated  Diet: Regular Diet    Follow-up with   Follow-up Appointments   Procedures   ??? FOLLOW UP VISIT Appointment in: Other (Specify) As scheduled with primary OBGYN     As scheduled with primary OBGYN     Standing Status:   Standing     Number of Occurrences:   1     Order Specific Question:   Appointment in     Answer:   Other (Specify)        Signed:  Ardell Isaacs, MD  12/29/2018  9:20 AM

## 2018-12-24 NOTE — Progress Notes (Signed)
Ante Partum Progress Note    Kaitlyn Fischer  [redacted]w[redacted]d    Assessment/Plan:   G1P0 @ [redacted]w[redacted]d   Right flank pain - nephrolithiasis vs pyelonephritis  Afebrile  Symptomatically improved  D/c home  Rx macrobid. Safe/effective use of medication reviewed.  F/u with OB within 1 week      Orders/Charges: Medium    Patient states her flank pain is improved and tolerable with tylenol. Voiding. Tolerating PO. Denies contractions, leakage of fluid or vaginal bleeding. Good fetal movement.     Vitals:  Visit Vitals  BP 104/56 (BP 1 Location: Right arm)   Pulse 70   Temp 98.3 ??F (36.8 ??C)   Resp 16   Ht 5\' 3"  (1.6 m)   Wt 61.3 kg (135 lb 2.3 oz)   SpO2 100%   BMI 23.94 kg/m??     No data recorded.      Last 24hr Input/Output:  No intake or output data in the 24 hours ending 12/29/18 0916     Non stress test:  Appropriate for EGA    No data found. No data found.     Uterine Activity: None     Exam:  Patient without distress.     Abdomen, fundus soft non-tender, no CVAT     Extremities, no redness or tenderness             Labs:     Lab Results   Component Value Date/Time    WBC 11.4 (H) 12/23/2018 10:45 AM    WBC 9.8 (H) 07/24/2009 08:30 PM    HGB 10.5 (L) 12/23/2018 10:45 AM    HGB 11.3 07/24/2009 08:30 PM    HCT 30.9 (L) 12/23/2018 10:45 AM    HCT 33.8 07/24/2009 08:30 PM    PLATELET 231 12/23/2018 10:45 AM    PLATELET 257 07/24/2009 08:30 PM       No results found for this or any previous visit (from the past 24 hour(s)).      *late entry as computers were down at time of encounter/discharge

## 2018-12-24 NOTE — Progress Notes (Signed)
0730: Bedside shift change report given to L. Choate, RN (Cabin crew) by Alvira Monday (offgoing nurse). Report included the following information SBAR.

## 2018-12-24 NOTE — Other (Signed)
1036 dc instructions reviewed, computers down, not able to print copy to give to pt. Pt verbalized understaning, pt ambulated home with family in a stable undelivered condition

## 2018-12-24 NOTE — Discharge Summary (Signed)
Antepartum  Discharge Summary     Patient ID:  Kaitlyn Fischer  [id number]  27 y.o.  04/17/1991    Admit date: 12/23/2018    Discharge date: 12/29/2018    Admission Diagnoses:    Patient Active Problem List   Diagnosis Code   ??? Flank pain in pregnant patient O26.899, R10.9       Discharge Diagnoses: There are no discharge diagnoses documented for the most recent discharge.  Patient Active Problem List   Diagnosis Code   ??? Flank pain in pregnant patient O26.899, R10.9       Procedures for this admission: Renal US: mild right hydronephrosis    Hospital Course: Admitted at [redacted]w[redacted]d with right flank pain, afebrile - nephrolithiasis vs pyelonephritis. Remained afebrile. Symptomatically improved with IV hydration, PO tylenol. Discharged home in good condition at [redacted]w[redacted]d.     Disposition: home    Discharged Condition: good    Patient plans to return for changes in her condition or the condition of the baby or for delivery of the baby.    Patient Instructions:   Cannot display discharge medications since this patient is not currently admitted.    Activity: Activity as tolerated  Diet: Regular Diet    Follow-up with   Follow-up Appointments   Procedures   ??? FOLLOW UP VISIT Appointment in: Other (Specify) As scheduled with primary OBGYN     As scheduled with primary OBGYN     Standing Status:   Standing     Number of Occurrences:   1     Order Specific Question:   Appointment in     Answer:   Other (Specify)        Signed:  Faydra Korman L Alianah Lofton, MD  12/29/2018  9:20 AM

## 2018-12-24 NOTE — Progress Notes (Signed)
Ante Partum Progress Note    Kaitlyn Fischer  [redacted]w[redacted]d    Assessment/Plan:   G1P0 @ [redacted]w[redacted]d   Right flank pain - nephrolithiasis vs pyelonephritis  Afebrile  Symptomatically improved  D/c home  Rx macrobid. Safe/effective use of medication reviewed.  F/u with OB within 1 week      Orders/Charges: Medium    Patient states her flank pain is improved and tolerable with tylenol. Voiding. Tolerating PO. Denies contractions, leakage of fluid or vaginal bleeding. Good fetal movement.     Vitals:  Visit Vitals  BP 104/56 (BP 1 Location: Right arm)   Pulse 70   Temp 98.3 ??F (36.8 ??C)   Resp 16   Ht 5' 3" (1.6 m)   Wt 61.3 kg (135 lb 2.3 oz)   SpO2 100%   BMI 23.94 kg/m??     No data recorded.      Last 24hr Input/Output:  No intake or output data in the 24 hours ending 12/29/18 0916     Non stress test:  Appropriate for EGA    No data found. No data found.     Uterine Activity: None     Exam:  Patient without distress.     Abdomen, fundus soft non-tender, no CVAT     Extremities, no redness or tenderness             Labs:     Lab Results   Component Value Date/Time    WBC 11.4 (H) 12/23/2018 10:45 AM    WBC 9.8 (H) 07/24/2009 08:30 PM    HGB 10.5 (L) 12/23/2018 10:45 AM    HGB 11.3 07/24/2009 08:30 PM    HCT 30.9 (L) 12/23/2018 10:45 AM    HCT 33.8 07/24/2009 08:30 PM    PLATELET 231 12/23/2018 10:45 AM    PLATELET 257 07/24/2009 08:30 PM       No results found for this or any previous visit (from the past 24 hour(s)).      *late entry as computers were down at time of encounter/discharge

## 2018-12-24 NOTE — Progress Notes (Signed)
0730: Bedside shift change report given to L. Choate, RN (oncoming nurse) by T. JacksonRN (offgoing nurse). Report included the following information SBAR.

## 2018-12-25 LAB — CULTURE, URINE
Colonies Counted: >100000
Colony Count: >100000

## 2021-10-02 ENCOUNTER — Inpatient Hospital Stay: Admit: 2021-10-02 | Discharge: 2021-10-02 | Payer: PRIVATE HEALTH INSURANCE | Attending: Emergency Medicine

## 2023-03-09 ENCOUNTER — Emergency Department: Admit: 2023-03-10

## 2023-03-09 DIAGNOSIS — J069 Acute upper respiratory infection, unspecified: Secondary | ICD-10-CM

## 2023-03-09 NOTE — ED Provider Notes (Signed)
[facility]  EMERGENCY DEPARTMENT ENCOUNTER       Pt Name: Kaitlyn Fischer  MRN: [id number]  Birthdate Dec 06, 1990  Date of evaluation: 03/09/2023  Provider: Blenda Nicely, PA   PCP: None, None  Note Started: 9:56 PM EDT 03/09/23     CHIEF COMPLAINT       Chief Complaint   Patient presents with    Illness     Patient reports chills, upset stomach, headache, and just "feeling horrible". She advised that this has been going on since yesterday. Son was sick last week and recently got over it. Patient having subjective fevers. Patient took Theraflu today with no relief. Denies any other OTC medications.      HISTORY OF PRESENT ILLNESS: 1 or more elements      History From: Patient  HPI Limitations: None     Kaitlyn Fischer is a 32 y.o. female who presents by POV for generalized illness. She started feeling ill yesterday. She notes low grade fevers, cough, congestion, headache and sore throat.  Her son has been sick with a viral URI.  She is not vaccinated for influenza but did receive COVID vaccinations. She has taken Mucinex and Theraful without relief.      Nursing Notes were all reviewed and agreed with or any disagreements were addressed in the HPI.     REVIEW OF SYSTEMS      Review of Systems     Positives and Pertinent negatives as per HPI.    PAST HISTORY     Past Medical History:  Past Medical History:   Diagnosis Date    Second hand smoke exposure        Past Surgical History:  No past surgical history on file.    Family History:  No family history on file.    Social History:       Allergies:  Allergies   Allergen Reactions    Apple Swelling     Lips swell per pt    Hydrocodone Swelling     Per pt, lips swell when she has taken       CURRENT MEDICATIONS      Previous Medications    No medications on file       SCREENINGS               No data recorded        PHYSICAL EXAM      ED Triage Vitals [03/09/23 2108]   Enc Vitals Group      BP 130/61      Pulse (!) 113      Respirations 16      Temp 100.4 F (38  C)      Temp Source Oral      SpO2 100 %      Weight - Scale 72.7 kg (160 lb 4.4 oz)      Height 1.6 m ( )      Head Circumference       Peak Flow       Pain Score       Pain Loc       Pain Edu?       Excl. in GC?         Physical Exam  Vitals and nursing note reviewed.   Constitutional:       General: She is not in acute distress.     Appearance: Normal appearance. She is not ill-appearing.      Comments: 32 y.o. African-American  female    HENT:      Head: Normocephalic and atraumatic.      Nose: Congestion and rhinorrhea present.      Mouth/Throat:      Mouth: Mucous membranes are moist.      Pharynx: Oropharynx is clear. No oropharyngeal exudate or posterior oropharyngeal erythema.   Cardiovascular:      Rate and Rhythm: Normal rate.      Heart sounds: No murmur heard.  Pulmonary:      Effort: Pulmonary effort is normal. No respiratory distress.      Breath sounds: Normal breath sounds. No wheezing.   Musculoskeletal:      Cervical back: Normal range of motion.   Neurological:      Mental Status: She is alert.          DIAGNOSTIC RESULTS   LABS:     Recent Results (from the past 24 hour(s))   COVID-19, Rapid    Collection Time: 03/09/23  9:20 PM    Specimen: Nasopharyngeal   Result Value Ref Range    Source Nasopharyngeal      SARS-CoV-2, Rapid Not detected NOTD     Rapid influenza A/B antigens    Collection Time: 03/09/23  9:20 PM    Specimen: Nasopharyngeal   Result Value Ref Range    Influenza A Ag Negative NEG      Influenza B Ag Negative NEG     Urinalysis with Reflex to Culture    Collection Time: 03/09/23 11:00 PM    Specimen: Urine   Result Value Ref Range    Color, UA YELLOW/STRAW      Appearance CLEAR CLEAR      Specific Gravity, UA 1.025      pH, Urine 5.5 5.0 - 8.0      Protein, UA Negative NEG mg/dL    Glucose, UA Negative NEG mg/dL    Ketones, Urine 80 (A) NEG mg/dL    Bilirubin Urine Negative NEG      Blood, Urine Negative NEG      Urobilinogen, Urine 1.0 0.2 - 1.0 EU/dL    Nitrite, Urine  Negative NEG      Leukocyte Esterase, Urine SMALL (A) NEG      WBC, UA 0-4 0 - 4 /hpf    RBC, UA 0-5 0 - 5 /hpf    Epithelial Cells UA FEW FEW /lpf    BACTERIA, URINE Negative NEG /hpf    Urine Culture if Indicated CULTURE NOT INDICATED BY UA RESULT CNI     POC Pregnancy Urine Qual    Collection Time: 03/09/23 11:01 PM   Result Value Ref Range    Preg Test, Ur Negative NEG       EKG: When ordered, EKG's are interpreted by the Emergency Department Physician in the absence of a cardiologist.  Please see their note for interpretation of EKG.      RADIOLOGY:  Non-plain film images such as CT, Ultrasound and MRI are read by the radiologist. Plain radiographic images are visualized and preliminarily interpreted by the ED Provider with the below findings:     N/A     Interpretation per the Radiologist below, if available at the time of this note:     XR CHEST (2 VW)    Result Date: 03/09/2023  EXAM: XR CHEST (2 VW) INDICATION: cough COMPARISON: Chest radiograph 02/04/2010 TECHNIQUE: PA and lateral chest views FINDINGS: Cardiomediastinal silhouette within normal limits. Lungs and pleural spaces grossly clear.  No acute findings.       PROCEDURES   Unless otherwise noted below, none  Procedures     CRITICAL CARE TIME   Patient does not meet Critical Care Time, 0 minutes     EMERGENCY DEPARTMENT COURSE and DIFFERENTIAL DIAGNOSIS/MDM   Vitals:    Vitals:    03/09/23 2108 03/09/23 2253 03/09/23 2345   BP: 130/61     Pulse: (!) 113  96   Resp: 16  18   Temp: 100.4 F (38 C)     TempSrc: Oral     SpO2: 100% 98% 97%   Weight: 72.7 kg (160 lb 4.4 oz)     Height: 1.6 m (5\' 3" )          Patient was given the following medications:  Medications   acetaminophen (TYLENOL) tablet 650 mg (650 mg Oral Given 03/09/23 2220)   ketorolac (TORADOL) injection 30 mg (30 mg IntraMUSCular Given 03/10/23 0016)       CONSULTS: (Who and What was discussed)  None    Chronic Conditions:  has a past medical history of Second hand smoke exposure.      Social Determinants affecting Dx or Tx: None    Records Reviewed (source and summary of external records): Nursing Notes    CC/HPI Summary, DDx, ED Course, and Reassessment: Patient resents ED slightly tachycardic with upper respiratory complaints.  She reports fevers, cough, congestion, and sore throat.  Exam shows cough but is otherwise benign.  There is no evidence of exudative pharyngitis.  COVID and flu testing is negative.  X-ray is negative for pneumonia.  Likely viral URI.  Will treat with steroids, Fioricet for headache, and Zyrtec.  Patient also encouraged to take over-the-counter cough and cold medications and drink plenty of fluids.  She should follow-up with primary care medicine if not proving, is return to the ED if things worsen.         Disposition Considerations (Tests not done, Shared Decision Making, Pt Expectation of Test or Tx.): none     FINAL IMPRESSION     1. Viral URI        DISPOSITION/PLAN   DISPOSITION Decision To Discharge 03/10/2023 12:41:57 AM    Discharge Note: The patient is stable for discharge home. The signs, symptoms, diagnosis, and discharge instructions have been discussed, understanding conveyed, and agreed upon. The patient is to follow up as recommended or return to ER should their symptoms worsen.      PATIENT REFERRED TO:  Your PCP    In 1 week  As needed, If not improved    [facility]  [address]  [place] [address]  [phone]    If symptoms worsen       DISCHARGE MEDICATIONS:     Medication List        START taking these medications      butalbital-APAP-caffeine 50-300-40 MG Caps per capsule  Commonly known as: Fioricet  Take 1 capsule by mouth every 6 hours as needed for Headaches     cetirizine 10 MG tablet  Commonly known as: ZYRTEC  Take 1 tablet by mouth daily     predniSONE 10 MG tablet  Commonly known as: DELTASONE  Take 6 tablets by mouth daily for 1 day, THEN 5 tablets daily for 1 day, THEN 4 tablets daily for 1 day, THEN 3  tablets daily for 1 day, THEN 2 tablets daily for 1 day, THEN 1 tablet daily for 1 day.  Start taking on: March 10, 2023               Where to Get Your Medications        These medications were sent to [facility] [address], [place] - [address] - [phone]insberg [phone]  [address], [place] [address]      Phone: [phone]   butalbital-APAP-caffeine 50-300-40 MG Caps per capsule  cetirizine 10 MG tablet  predniSONE 10 MG tablet           DISCONTINUED MEDICATIONS:  Current Discharge Medication List        I have seen and evaluated the patient autonomously. My supervision physician was on site and available for consultation if needed.     I am the Primary Clinician of Record.   Blenda Nicely, PA (electronically signed)    (Please note that parts of this dictation were completed with voice recognition software. Quite often unanticipated grammatical, syntax, homophones, and other interpretive errors are inadvertently transcribed by the computer software. Please disregards these errors. Please excuse any errors that have escaped final proofreading.)       Blenda Nicely, [place]  03/10/23 0120

## 2023-03-10 ENCOUNTER — Inpatient Hospital Stay: Admit: 2023-03-10 | Discharge: 2023-03-10 | Disposition: A | Discharge status: Home / Self Care

## 2023-03-10 LAB — COVID-19, RAPID: SARS-CoV-2, Rapid: NOT DETECTED

## 2023-03-10 LAB — URINALYSIS WITH REFLEX TO CULTURE
BACTERIA, URINE: NEGATIVE /hpf
Bilirubin Urine: NEGATIVE
Blood, Urine: NEGATIVE
Glucose, UA: NEGATIVE mg/dL
Ketones, Urine: 80 mg/dL — AB
Nitrite, Urine: NEGATIVE
Protein, UA: NEGATIVE mg/dL
Specific Gravity, UA: 1.025
Urobilinogen, Urine: 1 EU/dL (ref 0.2–1.0)
pH, Urine: 5.5 (ref 5.0–8.0)

## 2023-03-10 LAB — POC PREGNANCY UR-QUAL: Preg Test, Ur: NEGATIVE

## 2023-03-10 LAB — RAPID INFLUENZA A/B ANTIGENS
Influenza A Ag: NEGATIVE
Influenza B Ag: NEGATIVE

## 2023-03-10 MED ORDER — KETOROLAC TROMETHAMINE 30 MG/ML IJ SOLN
30 | INTRAMUSCULAR | Status: AC
Start: 2023-03-10 — End: 2023-03-10
  Administered 2023-03-10: 04:00:00 30 mg via INTRAMUSCULAR

## 2023-03-10 MED ORDER — ACETAMINOPHEN 325 MG PO TABS
325 | ORAL | Status: AC
Start: 2023-03-10 — End: 2023-03-09
  Administered 2023-03-10: 02:00:00 650 mg via ORAL

## 2023-03-10 MED ORDER — BUTALBITAL-APAP-CAFFEINE 50-300-40 MG PO CAPS
50-300-40 | ORAL_CAPSULE | Freq: Four times a day (QID) | ORAL | 0 refills | Status: AC | PRN
Start: 2023-03-10 — End: 2023-03-20

## 2023-03-10 MED ORDER — PREDNISONE 10 MG PO TABS
10 | ORAL_TABLET | ORAL | 0 refills | Status: AC
Start: 2023-03-10 — End: 2023-03-15

## 2023-03-10 MED ORDER — CETIRIZINE HCL 10 MG PO TABS
10 | ORAL_TABLET | Freq: Every day | ORAL | 0 refills | Status: AC
Start: 2023-03-10 — End: ?

## 2023-03-10 MED FILL — KETOROLAC TROMETHAMINE 30 MG/ML IJ SOLN: 30 MG/ML | INTRAMUSCULAR | Qty: 1

## 2023-03-10 MED FILL — ACETAMINOPHEN 325 MG PO TABS: 325 MG | ORAL | Qty: 2

## 2023-03-10 NOTE — Discharge Instructions (Signed)
Thank You!    It was a pleasure taking care of you in our Emergency Department today. We know that when you come to our Emergency Department, you are entrusting [place] with your health, comfort, and safety. Our physicians and nurses honor that trust, and truly appreciate the opportunity to care for you and your loved ones.      We also value your feedback. If you receive a survey about your Emergency Department experience today, please fill it out.  We care about our patients' feedback, and we listen to what you have to say.  Thank you.    Blenda Nicely, PA  ________________________________________________________________________  I have included a copy of your lab results and/or radiologic studies from today's visit so you can have them easily available at your follow-up visit. We hope you feel better and please do not hesitate to contact the ED if you have any questions at all!    Recent Results (from the past 12 hour(s))   COVID-19, Rapid    Collection Time: 03/09/23  9:20 PM    Specimen: Nasopharyngeal   Result Value Ref Range    Source Nasopharyngeal      SARS-CoV-2, Rapid Not detected NOTD     Rapid influenza A/B antigens    Collection Time: 03/09/23  9:20 PM    Specimen: Nasopharyngeal   Result Value Ref Range    Influenza A Ag Negative NEG      Influenza B Ag Negative NEG     Urinalysis with Reflex to Culture    Collection Time: 03/09/23 11:00 PM    Specimen: Urine   Result Value Ref Range    Color, UA YELLOW/STRAW      Appearance CLEAR CLEAR      Specific Gravity, UA 1.025      pH, Urine 5.5 5.0 - 8.0      Protein, UA Negative NEG mg/dL    Glucose, UA Negative NEG mg/dL    Ketones, Urine 80 (A) NEG mg/dL    Bilirubin Urine Negative NEG      Blood, Urine Negative NEG      Urobilinogen, Urine 1.0 0.2 - 1.0 EU/dL    Nitrite, Urine Negative NEG      Leukocyte Esterase, Urine SMALL (A) NEG      WBC, UA 0-4 0 - 4 /hpf    RBC, UA 0-5 0 - 5 /hpf    Epithelial Cells UA FEW FEW /lpf    BACTERIA, URINE Negative NEG /hpf     Urine Culture if Indicated CULTURE NOT INDICATED BY UA RESULT CNI     POC Pregnancy Urine Qual    Collection Time: 03/09/23 11:01 PM   Result Value Ref Range    Preg Test, Ur Negative NEG       XR CHEST (2 VW)   Final Result   No acute findings.             The exam and treatment you received in the Emergency Department were for an urgent problem and are not intended as complete care. It is important that you follow up with a doctor, nurse practitioner, or physician assistant for ongoing care. If your symptoms become worse or you do not improve as expected and you are unable to reach your usual health care provider, you should return to the Emergency Department. We are available 24 hours a day.    Please take your discharge instructions with you when you go to your follow-up appointment.  If a prescription has been provided, please have it filled as soon as possible to prevent a delay in treatment. Read the entire medication instruction sheet provided to you by the pharmacy. If you have any questions or reservations about taking the medication due to side effects or interactions with other medications, please call your primary care physician or contact the ER to speak with the charge nurse.     Please make an appointment with your family doctor or the physician you were referred to for follow-up of this visit as instructed on your discharge paperwork. Return to the ER if you are unable to be seen or if you are unable to be seen in a timely manner.    Should you experience abdominal pain lasting greater than 6 hours, chest pain, difficulty breathing, fever/chills, numbness/tingling, skin changes or other symptoms that concern you, return to the ED sooner. If you feel worse over the next 24 hours, please return to the ED. We are available 24 hours a day. Thank you for trusting [place] with your care!

## 2023-03-17 ENCOUNTER — Inpatient Hospital Stay
Admit: 2023-03-17 | Discharge: 2023-03-17 | Disposition: A | Discharge status: Home / Self Care | Attending: Emergency Medicine

## 2023-03-17 DIAGNOSIS — H65191 Other acute nonsuppurative otitis media, right ear: Secondary | ICD-10-CM

## 2023-03-17 NOTE — Discharge Instructions (Addendum)
You are seen in the ED today for your right ear pain.  You have evidence of fluid behind the ear, but no sign of infection.    We have prescribed Flonase nasal spray, Sudafed, and cetirizine (zyrtec) for you to take, to help decrease congestion in your sinuses and help drain fluid from the ear.  Please take these as prescribed.  If your symptoms do not improve over the next week or 2, please follow-up with your primary care physician for reevaluation.    If you develop worsening pain, fevers, swelling behind or in front of the ear, difficulty swallowing or breathing, or concern for any other reason, please return to the ED.

## 2023-03-17 NOTE — ED Notes (Signed)
Pt discharged by Bost MD. Discharge instructions discussed and pt given opportunity to ask questions. Pt ambulatory out of ED

## 2023-03-17 NOTE — ED Provider Notes (Cosign Needed)
[facility]  EMERGENCY DEPARTMENT ENCOUNTER       Pt Name: Kaitlyn Fischer  MRN: [id number]  Birthdate 07-22-1991  Date of evaluation: 03/17/2023  Provider: Cain Sieve, MD   PCP: None, None (Inactive)  Note Started: 10:17 AM EDT 03/17/23     CHIEF COMPLAINT       Chief Complaint   Patient presents with    Otalgia     Patient ambulatory to triage with complaint of right ear pain x2 days. Patient denies fever, no other symptoms.         HISTORY OF PRESENT ILLNESS: 1 or more elements      History From: Patient  HPI Limitations: None     Kaitlyn Fischer is a 32 y.o. female who presents with a week or 2 of right ear discomfort.  She reports feeling that her hearing is muffled and some discomfort of the right ear, especially when out in the wind.  She does not have any pain of the neck, auricle, or mastoid.  She has had no fevers.  She developed the discomfort with a URI that she had last week.  The URI symptoms have all resolved, but she has continued with the right ear discomfort and muffled hearing, so presented for further evaluation.  She is otherwise without complaints     Nursing Notes were all reviewed and agreed with or any disagreements were addressed in the HPI.     REVIEW OF SYSTEMS      Review of Systems     Positives and Pertinent negatives as per HPI.    PAST HISTORY     Past Medical History:  Past Medical History:   Diagnosis Date    Second hand smoke exposure          Past Surgical History:  No past surgical history on file.    Family History:  No family history on file.    Social History:       Allergies:  Allergies   Allergen Reactions    Apple Swelling     Lips swell per pt    Hydrocodone Swelling     Per pt, lips swell when she has taken       CURRENT MEDICATIONS      Previous Medications    BUTALBITAL-APAP-CAFFEINE (FIORICET) 50-300-40 MG CAPS PER CAPSULE    Take 1 capsule by mouth every 6 hours as needed for Headaches    CETIRIZINE (ZYRTEC) 10 MG TABLET    Take 1 tablet by mouth daily        SCREENINGS               No data recorded        PHYSICAL EXAM      ED Triage Vitals [03/17/23 0945]   Enc Vitals Group      BP 109/76      Pulse 68      Respirations 16      Temp 97.6 F (36.4 C)      Temp Source Oral      SpO2 99 %      Weight - Scale 72.5 kg (159 lb 13.3 oz)      Height 1.6 m (5\' 3" )      Head Circumference       Peak Flow       Pain Score       Pain Loc       Pain Edu?       Excl.  in GC?               Physical Exam  Vitals and nursing note reviewed.   Constitutional:       General: She is not in acute distress.     Appearance: Normal appearance. She is not ill-appearing or diaphoretic.   HENT:      Right Ear: Ear canal and external ear normal.      Left Ear: Ear canal and external ear normal.      Ears:      Comments: There is small nonpurulent effusion behind right TM.  TM is otherwise nonerythematous, normal-appearing.   No mastoid tenderness     Nose: No congestion or rhinorrhea.   Eyes:      Extraocular Movements: Extraocular movements intact.      Conjunctiva/sclera: Conjunctivae normal.   Cardiovascular:      Rate and Rhythm: Normal rate and regular rhythm.   Pulmonary:      Effort: Pulmonary effort is normal. No respiratory distress.   Abdominal:      General: Abdomen is flat. There is no distension.   Musculoskeletal:      Cervical back: No rigidity.      Right lower leg: No edema.      Left lower leg: No edema.   Skin:     General: Skin is warm and dry.   Neurological:      General: No focal deficit present.      Mental Status: She is alert and oriented to person, place, and time.   Psychiatric:         Mood and Affect: Mood normal.         Behavior: Behavior normal.            DIAGNOSTIC RESULTS   LABS:     No results found for this or any previous visit (from the past 24 hour(s)).       RADIOLOGY:  Non-plain film images such as CT, Ultrasound and MRI are read by the radiologist. Plain radiographic images are visualized and preliminarily interpreted by the ED Provider with the  below findings:     Interpretation per the Radiologist below, if available at the time of this note:     No orders to display           PROCEDURES   Unless otherwise noted below, none  Procedures       CRITICAL CARE TIME       SCREENINGS   NIH Stroke Score       Heart Score       Curb-65          EMERGENCY DEPARTMENT COURSE and DIFFERENTIAL DIAGNOSIS/[facility]   Vitals:    Vitals:    03/17/23 0945   BP: 109/76   Pulse: 68   Resp: 16   Temp: 97.6 F (36.4 C)   TempSrc: Oral   SpO2: 99%   Weight: 72.5 kg (159 lb 13.3 oz)   Height: 1.6 m (5\' 3" )            Patient was given the following medications:  Medications - No data to display    CONSULTS: (Who and What was discussed)  None      Chronic Conditions: None    Social Determinants affecting Dx or Tx: None    Records Reviewed (source and summary of external notes): Nursing Notes and Old Medical Records    CC/HPI Summary, DDx, ED Course, and Reassessment:  Patient is an otherwise healthy 32 year old female presenting with right ear discomfort and muffled hearing for the past week, in the context of recent URI.  On exam, she is well-appearing female in no acute distress.  Afebrile.  There is a small effusion behind the right TM, but the ear otherwise has no signs of infection, including normal nonerythematous canal and TM.  She has no mastoid tenderness to suggest occult infection or mass.  I have no suspicion of any other acute emergent pathology causing her right ear discomfort.  Recommended OTC medications including antihistamines, Sudafed to assist with decongestion.  Offered prescriptions, however patient declines.  Discussed strict return precautions, to which patient verbalized understanding.       FINAL IMPRESSION     1. Acute MEE (middle ear effusion), right          DISPOSITION/PLAN   DISPOSITION Decision To Discharge 03/17/2023 10:12:06 AM      Discharge Note: The patient is stable for discharge home. The signs, symptoms, diagnosis, and discharge instructions  have been discussed, understanding conveyed, and agreed upon. The patient is to follow up as recommended or return to ER should their symptoms worsen.      PATIENT REFERRED TO:  [facility]  [address]  [place] [address]  [phone]  Go to   As needed         DISCHARGE MEDICATIONS:     Medication List        ASK your doctor about these medications      butalbital-APAP-caffeine 50-300-40 MG Caps per capsule  Commonly known as: Fioricet  Take 1 capsule by mouth every 6 hours as needed for Headaches     cetirizine 10 MG tablet  Commonly known as: ZYRTEC  Take 1 tablet by mouth daily                DISCONTINUED MEDICATIONS:  Current Discharge Medication List          I am the Primary Clinician of Record.   Cain Sieve, MD (electronically signed)      (Please note that parts of this dictation were completed with voice recognition software. Quite often unanticipated grammatical, syntax, homophones, and other interpretive errors are inadvertently transcribed by the computer software. Please disregards these errors. Please excuse any errors that have escaped final proofreading.)

## 2024-02-16 ENCOUNTER — Inpatient Hospital Stay
Admit: 2024-02-16 | Discharge: 2024-02-16 | Disposition: A | Discharge status: Home / Self Care | Payer: BLUE CROSS/BLUE SHIELD

## 2024-02-16 DIAGNOSIS — J101 Influenza due to other identified influenza virus with other respiratory manifestations: Secondary | ICD-10-CM

## 2024-02-16 LAB — URINALYSIS WITH REFLEX TO CULTURE
Bilirubin, Urine: NEGATIVE
Blood, Urine: NEGATIVE
Glucose, Ur: NEGATIVE mg/dL
Ketones, Urine: 80 mg/dL — AB
Leukocyte Esterase, Urine: NEGATIVE
Nitrite, Urine: NEGATIVE
Protein, UA: 30 mg/dL — AB
Specific Gravity, UA: >1.03 — ABNORMAL HIGH (ref 1.003–1.030)
Urobilinogen, Urine: 1 EU/dL (ref 0.2–1.0)
pH, Urine: 6 (ref 5.0–8.0)

## 2024-02-16 LAB — COMPREHENSIVE METABOLIC PANEL
ALT: 22 U/L (ref 12–78)
AST: 14 U/L — ABNORMAL LOW (ref 15–37)
Albumin/Globulin Ratio: 0.9 — ABNORMAL LOW (ref 1.1–2.2)
Albumin: 4.4 g/dL (ref 3.5–5.0)
Alk Phosphatase: 62 U/L (ref 45–117)
Anion Gap: 8 mmol/L (ref 2–12)
BUN/Creatinine Ratio: 9 — ABNORMAL LOW (ref 12–20)
BUN: 9 mg/dL (ref 6–20)
CO2: 23 mmol/L (ref 21–32)
Calcium: 9.6 mg/dL (ref 8.5–10.1)
Chloride: 101 mmol/L (ref 97–108)
Creatinine: 0.99 mg/dL (ref 0.55–1.02)
Est, Glom Filt Rate: 79 mL/min/{1.73_m2} (ref >60)
Globulin: 4.9 g/dL — ABNORMAL HIGH (ref 2.0–4.0)
Glucose: 97 mg/dL (ref 65–100)
Potassium: 3.4 mmol/L — ABNORMAL LOW (ref 3.5–5.1)
Sodium: 132 mmol/L — ABNORMAL LOW (ref 136–145)
Total Bilirubin: 0.6 mg/dL (ref 0.2–1.0)
Total Protein: 9.3 g/dL — ABNORMAL HIGH (ref 6.4–8.2)

## 2024-02-16 LAB — POC PREGNANCY UR-QUAL: Preg Test, Ur: NEGATIVE

## 2024-02-16 LAB — COVID-19 & INFLUENZA COMBO
Rapid Influenza A By PCR: DETECTED — AB
Rapid Influenza B By PCR: NOT DETECTED
SARS-CoV-2, PCR: NOT DETECTED

## 2024-02-16 LAB — CBC WITH AUTO DIFFERENTIAL
Basophils %: 0.4 % (ref 0.0–1.0)
Basophils Absolute: 0.03 10*3/uL (ref 0.00–0.10)
Eosinophils %: 0 % (ref 0.0–7.0)
Eosinophils Absolute: 0 10*3/uL (ref 0.00–0.40)
Hematocrit: 40.5 % (ref 35.0–47.0)
Hemoglobin: 13.6 g/dL (ref 11.5–16.0)
Immature Granulocytes %: 0.4 % (ref 0.0–0.5)
Immature Granulocytes Absolute: 0.03 10*3/uL (ref 0.00–0.04)
Lymphocytes %: 7.9 % — ABNORMAL LOW (ref 12.0–49.0)
Lymphocytes Absolute: 0.59 10*3/uL — ABNORMAL LOW (ref 0.80–3.50)
MCH: 30 pg (ref 26.0–34.0)
MCHC: 33.6 g/dL (ref 30.0–36.5)
MCV: 89.4 FL (ref 80.0–99.0)
MPV: 10.4 FL (ref 8.9–12.9)
Monocytes %: 6.1 % (ref 5.0–13.0)
Monocytes Absolute: 0.46 10*3/uL (ref 0.00–1.00)
Neutrophils %: 85.2 % — ABNORMAL HIGH (ref 32.0–75.0)
Neutrophils Absolute: 6.39 10*3/uL (ref 1.80–8.00)
Nucleated RBCs: 0 /100{WBCs}
Platelets: 222 10*3/uL (ref 150–400)
RBC: 4.53 M/uL (ref 3.80–5.20)
RDW: 12.5 % (ref 11.5–14.5)
WBC: 7.5 10*3/uL (ref 3.6–11.0)
nRBC: 0 10*3/uL (ref 0.00–0.01)

## 2024-02-16 LAB — LIPASE: Lipase: 37 U/L (ref 13–75)

## 2024-02-16 LAB — HCG, SERUM, QUALITATIVE: Preg, Serum: NEGATIVE

## 2024-02-16 MED ORDER — ONDANSETRON HCL 4 MG/2ML IJ SOLN
4 | INTRAMUSCULAR | Status: AC
Start: 2024-02-16 — End: 2024-02-16
  Administered 2024-02-16: 17:00:00 4 mg via INTRAVENOUS

## 2024-02-16 MED ORDER — ONDANSETRON 4 MG PO TBDP
4 | ORAL_TABLET | Freq: Three times a day (TID) | ORAL | 0 refills | Status: AC | PRN
Start: 2024-02-16 — End: ?

## 2024-02-16 MED ORDER — KETOROLAC TROMETHAMINE 30 MG/ML IJ SOLN
30 | Freq: Once | INTRAMUSCULAR | Status: AC
Start: 2024-02-16 — End: 2024-02-16
  Administered 2024-02-16: 17:00:00 15 mg via INTRAVENOUS

## 2024-02-16 MED ORDER — ACETAMINOPHEN 500 MG PO TABS
500 | ORAL | Status: AC
Start: 2024-02-16 — End: 2024-02-16
  Administered 2024-02-16: 17:00:00 1000 mg via ORAL

## 2024-02-16 MED ORDER — IBUPROFEN 600 MG PO TABS
600 | ORAL_TABLET | Freq: Three times a day (TID) | ORAL | 0 refills | Status: AC | PRN
Start: 2024-02-16 — End: ?

## 2024-02-16 MED ORDER — SODIUM CHLORIDE 0.9 % IV BOLUS
0.9 | Freq: Once | INTRAVENOUS | Status: AC
Start: 2024-02-16 — End: 2024-02-16
  Administered 2024-02-16: 17:00:00 1000 mL via INTRAVENOUS

## 2024-02-16 MED ORDER — ACETAMINOPHEN 500 MG PO TABS
500 | ORAL_TABLET | Freq: Four times a day (QID) | ORAL | 0 refills | Status: AC | PRN
Start: 2024-02-16 — End: ?

## 2024-02-16 MED FILL — ONDANSETRON HCL 4 MG/2ML IJ SOLN: 4 MG/2ML | INTRAMUSCULAR | Qty: 2 | Fill #0

## 2024-02-16 MED FILL — ACETAMINOPHEN 500 MG PO TABS: 500 MG | ORAL | Qty: 2 | Fill #0

## 2024-02-16 MED FILL — KETOROLAC TROMETHAMINE 30 MG/ML IJ SOLN: 30 MG/ML | INTRAMUSCULAR | Qty: 1 | Fill #0

## 2024-02-16 NOTE — ED Provider Notes (Signed)
 [facility]  EMERGENCY DEPARTMENT ENCOUNTER       Pt Name: Kaitlyn Fischer  MRN: [id number]  Birthdate 11/09/1991  Date of evaluation: 02/16/2024  Provider: Anell Barr, PA-C   PCP: None, None  Note Started: 12:42 PM EDT 02/16/24     CHIEF COMPLAINT       Chief Complaint   Patient presents with    Influenza     Pt has been exposed to her son whom is Flu A positive. Pt c/o flu like s/s, vomiting, and sinus HA. Pt denies OTC meds PTA        HISTORY OF PRESENT ILLNESS: 1 or more elements      History From: Patient  HPI Limitations: None     Kaitlyn Fischer is a 33 y.o. female who presents for evaluation with complaint of fever, myalgias, headache, nausea, vomiting, chills, cough x 1 day.  Patient reports that symptoms are yesterday.  Reports that her son recently tested positive for flu A and was sick with similar symptoms.  Patient reports that she has vomited multiple times, describes nonbloody, nonbilious emesis.  Patient reports that she has tried taking medication but has not been able to keep anything down.  Patient denies hematemesis or coffee-ground emesis.  She denies abdominal pain.  She denies dysuria, hematuria, urinary frequency, urgency, urine hesitancy.  She denies any diarrhea or constipation.  Denies chest pain or shortness of breath.  Denies purulent phlegm or hemoptysis.       Nursing Notes were all reviewed and agreed with or any disagreements were addressed in the HPI.     REVIEW OF SYSTEMS      Review of Systems     Positives and Pertinent negatives as per HPI.    PAST HISTORY     Past Medical History:  Past Medical History:   Diagnosis Date    Second hand smoke exposure        Past Surgical History:  No past surgical history on file.    Family History:  No family history on file.    Social History:       Allergies:  Allergies   Allergen Reactions    Apple Swelling     Lips swell per pt    Hydrocodone Swelling     Per pt, lips swell when she has taken       CURRENT  MEDICATIONS      Discharge Medication List as of 02/16/2024  3:12 PM        CONTINUE these medications which have NOT CHANGED    Details   butalbital-APAP-caffeine (FIORICET) 50-300-40 MG CAPS per capsule Take 1 capsule by mouth every 6 hours as needed for Headaches, Disp-30 capsule, R-0Normal      cetirizine (ZYRTEC) 10 MG tablet Take 1 tablet by mouth daily, Disp-30 tablet, R-0Normal             SCREENINGS               No data recorded        PHYSICAL EXAM      ED Triage Vitals [02/16/24 1224]   Encounter Vitals Group      BP 125/89      Systolic BP Percentile       Diastolic BP Percentile       Pulse (!) 116      Respirations 15      Temp (!) 101.8 F (38.8 C)      Temp src  SpO2 98 %      Weight - Scale 74.8 kg (165 lb)      Height 1.6 m (5\' 3" )      Head Circumference       Peak Flow       Pain Score       Pain Loc       Pain Education       Exclude from Growth Chart         Physical Exam  Constitutional:       General: She is not in acute distress.     Appearance: She is well-developed. She is not ill-appearing or toxic-appearing.      Comments: Tired appearing 33 year old female sitting upright, speaking clearly no acute distress   HENT:      Head: Normocephalic and atraumatic.      Right Ear: Tympanic membrane, ear canal and external ear normal.      Left Ear: Tympanic membrane, ear canal and external ear normal.      Mouth/Throat:      Pharynx: No oropharyngeal exudate or posterior oropharyngeal erythema.   Eyes:      Conjunctiva/sclera: Conjunctivae normal.   Cardiovascular:      Rate and Rhythm: Normal rate.   Pulmonary:      Effort: Pulmonary effort is normal. No respiratory distress.      Breath sounds: No wheezing or rales.   Abdominal:      General: Abdomen is flat.   Musculoskeletal:      Cervical back: Normal range of motion.   Skin:     General: Skin is warm and dry.   Neurological:      General: No focal deficit present.      Mental Status: She is alert and oriented to person, place, and  time.   Psychiatric:         Mood and Affect: Mood normal.         Behavior: Behavior normal.          DIAGNOSTIC RESULTS   LABS:     Recent Results (from the past 24 hours)   COVID-19 & Influenza Combo    Collection Time: 02/16/24 12:51 PM    Specimen: Nasopharyngeal   Result Value Ref Range    Source Nasopharyngeal      SARS-CoV-2, PCR Not detected NOTD      Rapid Influenza A By PCR Detected (A) NOTD      Rapid Influenza B By PCR Not detected NOTD     CBC with Auto Differential    Collection Time: 02/16/24 12:51 PM   Result Value Ref Range    WBC 7.5 3.6 - 11.0 K/uL    RBC 4.53 3.80 - 5.20 M/uL    Hemoglobin 13.6 11.5 - 16.0 g/dL    Hematocrit 09.8 11.9 - 47.0 %    MCV 89.4 80.0 - 99.0 FL    MCH 30.0 26.0 - 34.0 PG    MCHC 33.6 30.0 - 36.5 g/dL    RDW 14.7 82.9 - 56.2 %    Platelets 222 150 - 400 K/uL    MPV 10.4 8.9 - 12.9 FL    Nucleated RBCs 0.0 0 PER 100 WBC    nRBC 0.00 0.00 - 0.01 K/uL    Neutrophils % 85.2 (H) 32.0 - 75.0 %    Lymphocytes % 7.9 (L) 12.0 - 49.0 %    Monocytes % 6.1 5.0 - 13.0 %    Eosinophils % 0.0 0.0 -  7.0 %    Basophils % 0.4 0.0 - 1.0 %    Immature Granulocytes % 0.4 0.0 - 0.5 %    Neutrophils Absolute 6.39 1.80 - 8.00 K/UL    Lymphocytes Absolute 0.59 (L) 0.80 - 3.50 K/UL    Monocytes Absolute 0.46 0.00 - 1.00 K/UL    Eosinophils Absolute 0.00 0.00 - 0.40 K/UL    Basophils Absolute 0.03 0.00 - 0.10 K/UL    Immature Granulocytes Absolute 0.03 0.00 - 0.04 K/UL    Differential Type SMEAR SCANNED      RBC Comment NORMOCYTIC, NORMOCHROMIC     Comprehensive Metabolic Panel    Collection Time: 02/16/24 12:51 PM   Result Value Ref Range    Sodium 132 (L) 136 - 145 mmol/L    Potassium 3.4 (L) 3.5 - 5.1 mmol/L    Chloride 101 97 - 108 mmol/L    CO2 23 21 - 32 mmol/L    Anion Gap 8 2 - 12 mmol/L    Glucose 97 65 - 100 mg/dL    BUN 9 6 - 20 MG/DL    Creatinine 2.72 5.36 - 1.02 MG/DL    BUN/Creatinine Ratio 9 (L) 12 - 20      Est, Glom Filt Rate 79 >60 ml/min/1.62m2    Calcium 9.6 8.5 - 10.1 MG/DL     Total Bilirubin 0.6 0.2 - 1.0 MG/DL    ALT 22 12 - 78 U/L    AST 14 (L) 15 - 37 U/L    Alk Phosphatase 62 45 - 117 U/L    Total Protein 9.3 (H) 6.4 - 8.2 g/dL    Albumin 4.4 3.5 - 5.0 g/dL    Globulin 4.9 (H) 2.0 - 4.0 g/dL    Albumin/Globulin Ratio 0.9 (L) 1.1 - 2.2     Lipase    Collection Time: 02/16/24 12:51 PM   Result Value Ref Range    Lipase 37 13 - 75 U/L   HCG Qualitative, Serum    Collection Time: 02/16/24 12:51 PM   Result Value Ref Range    Preg, Serum Negative NEG     Urinalysis with Reflex to Culture    Collection Time: 02/16/24 12:51 PM    Specimen: Urine   Result Value Ref Range    Color, UA DARK YELLOW      Appearance CLEAR CLEAR      Specific Gravity, UA >1.030 (H) 1.003 - 1.030    pH, Urine 6.0 5.0 - 8.0      Protein, UA 30 (A) NEG mg/dL    Glucose, Ur Negative NEG mg/dL    Ketones, Urine 80 (A) NEG mg/dL    Bilirubin, Urine Negative NEG      Blood, Urine Negative NEG      Urobilinogen, Urine 1.0 0.2 - 1.0 EU/dL    Nitrite, Urine Negative NEG      Leukocyte Esterase, Urine Negative NEG      WBC, UA 0-4 0 - 4 /hpf    RBC, UA 0-5 0 - 5 /hpf    Epithelial Cells, UA FEW FEW /lpf    BACTERIA, URINE 1+ (A) NEG /hpf    Urine Culture if Indicated CULTURE NOT INDICATED BY UA RESULT CNI      Mucus, UA TRACE (A) NEG /lpf   POC Pregnancy Urine Qual    Collection Time: 02/16/24 12:52 PM   Result Value Ref Range    Preg Test, Ur Negative NEG  EKG: When ordered, EKG's are interpreted by the Emergency Department Physician in the absence of a cardiologist.  Please see their note for interpretation of EKG.      RADIOLOGY:  Non-plain film images such as CT, Ultrasound and MRI are read by the radiologist. Plain radiographic images are visualized and preliminarily interpreted by the ED Provider with the below findings:     none     Interpretation per the Radiologist below, if available at the time of this note:     No results found.     PROCEDURES   Unless otherwise noted below, none  Procedures      CRITICAL CARE TIME   Patient does not meet Critical Care Time, 0 minutes     EMERGENCY DEPARTMENT COURSE and DIFFERENTIAL DIAGNOSIS/MDM   Vitals:    Vitals:    02/16/24 1224 02/16/24 1339   BP: 125/89    Pulse: (!) 116 100   Resp: 15    Temp: (!) 101.8 F (38.8 C) 99.1 F (37.3 C)   TempSrc:  Oral   SpO2: 98%    Weight: 74.8 kg (165 lb)    Height: 1.6 m (5\' 3" )         Patient was given the following medications:  Medications   acetaminophen (TYLENOL) tablet 1,000 mg (1,000 mg Oral Given 02/16/24 1255)   ondansetron (ZOFRAN) injection 4 mg (4 mg IntraVENous Given 02/16/24 1256)   ketorolac (TORADOL) injection 15 mg (15 mg IntraVENous Given 02/16/24 1257)   sodium chloride 0.9 % bolus 1,000 mL (0 mLs IntraVENous Stopped 02/16/24 1516)       CONSULTS: (Who and What was discussed)  None    Chronic Conditions:  has a past medical history of Second hand smoke exposure.     Social Determinants affecting Dx or Tx: None    Records Reviewed (source and summary of external records): Nursing Notes and Old Medical Records    CC/HPI Summary, DDx, ED Course, and Reassessment: This 32 year odl patient presents with symptoms suspicious for likely viral upper respiratory infection.  Patient son recently sick with similar symptoms.      Differential includes COVID, flu, RSV, other viral URI, viral sinusitis, allergic rhinitis, electrolyte abnormality, dehydration, gastritis/GERD, electrolyte disturbance, AKI,     Given presentation, low suspicion at this time for acute obstruction, intussusception, torsion, UTI, or significant intra-abdominal infection such as acute appendicitis.  Pt is well-hydrated on exam and has no signs of dehydration.    Will obtain lab work to include CBC, CMP, lipase as well as provide IV fluid resuscitation, Zofran for vomiting and Tylenol and Toradol for fevers.  Will continue to monitor closely and reassess.  Will obtain covid/flu and reassess.  If significant derangement and lab work will consider the need  for additional imaging.  Will p.o. challenge and reassess.      ED Course as of 02/16/24 1607   Tue Feb 16, 2024   1307 Pregnancy, Urine: Negative [LK]   1322 WBC: 7.5 [LK]   1322 Hemoglobin Quant: 13.6 [LK]   1322 Platelet Count: 222 [LK]   1344 Temp: 99.1 F (37.3 C) [LK]   1344 Pulse: 100  Fever and tachycardia both improving. [LK]   1344 Rapid Influenza A By PCR(!): Detected  Patient positive for influenza A which is likely cause of her fever, tachycardia, nausea and vomiting.  Do not suspect acute intra-abdominal process.  Patient has no leukocytosis, anemia, thrombocytopenia.  CMP, lipase are pending.  Will continue  to monitor in the ED. [LK]   1442 Sodium(!): 132  Did give IVF repletion  [LK]   1451 Reevluated patient, patient reports feeling much improved after IV fluids, Zofran and Toradol, temperature is improving, tachycardia is improved.  Patient appears slightly dry on labs, sodium 132, however otherwise unremarkable, patient did receive 1 L of IV fluids here she is tolerating p.o. well without difficulty, she has had water and graham crackers with no vomiting.  Did consider Tamiflu however given that she has GI symptoms, will avoid Tamiflu to prevent exacerbation.  Will send with Zofran and we discussed additional over-the-counter symptomatic supportive care.  Return precautions were discussed with patient verbalized understanding and agrees with this plan. [LK]      ED Course User Index  [LK] Anell Barr, PA-C         FINAL IMPRESSION     1. Influenza A    2. Nausea and vomiting, unspecified vomiting type    3. Dehydration          DISPOSITION/PLAN   DISPOSITION Decision To Discharge 02/16/2024 02:52:23 PM   DISPOSITION CONDITION Stable         Discharge Note: The patient is stable for discharge home. The signs, symptoms, diagnosis, and discharge instructions have been discussed, understanding conveyed, and agreed upon. The patient is to follow up as recommended or return to ER should their  symptoms worsen.      PATIENT REFERRED TO:  No follow-up provider specified.     DISCHARGE MEDICATIONS:     Medication List        START taking these medications      acetaminophen 500 MG tablet  Commonly known as: TYLENOL  Take 2 tablets by mouth every 6 hours as needed for Pain     ibuprofen 600 MG tablet  Commonly known as: ADVIL;MOTRIN  Take 1 tablet by mouth 3 times daily as needed for Pain     ondansetron 4 MG disintegrating tablet  Commonly known as: ZOFRAN-ODT  Take 1 tablet by mouth every 8 hours as needed for Nausea or Vomiting            ASK your doctor about these medications      butalbital-APAP-caffeine 50-300-40 MG Caps per capsule  Commonly known as: Fioricet  Take 1 capsule by mouth every 6 hours as needed for Headaches     cetirizine 10 MG tablet  Commonly known as: ZYRTEC  Take 1 tablet by mouth daily               Where to Get Your Medications        These medications were sent to [facility] [address] [place] - [address] - P [phone] - F [phone]  [address], [place] [address]      Phone: [phone]   acetaminophen 500 MG tablet  ibuprofen 600 MG tablet  ondansetron 4 MG disintegrating tablet           DISCONTINUED MEDICATIONS:  Discharge Medication List as of 02/16/2024  3:12 PM        I have seen and evaluated the patient autonomously. My supervision physician was on site and available for consultation if needed.     I am the Primary Clinician of Record.   Anell Barr, PA-C (electronically signed)    (Please note that parts of this dictation were completed with voice recognition software. Quite often unanticipated grammatical, syntax, homophones, and other interpretive  errors are inadvertently transcribed by the computer software. Please disregards these errors. Please excuse any errors that have escaped final proofreading.)       Anell Barr, PA-C  02/16/24 1608

## 2024-02-16 NOTE — ED Notes (Signed)
 Patient stable GCS15  Ambulatory not in any form of distress   Medication given   IV cannula removed   Discharged summary given and understood

## 2024-10-14 ENCOUNTER — Inpatient Hospital Stay
Admit: 2024-10-14 | Discharge: 2024-10-14 | Disposition: A | Discharge status: Home / Self Care | Payer: BLUE CROSS/BLUE SHIELD | Arrived: VH

## 2024-10-14 DIAGNOSIS — N939 Abnormal uterine and vaginal bleeding, unspecified: Principal | ICD-10-CM

## 2024-10-14 DIAGNOSIS — O209 Hemorrhage in early pregnancy, unspecified: Secondary | ICD-10-CM

## 2024-10-14 LAB — CBC WITH AUTO DIFFERENTIAL
Basophils %: 0.7 % (ref 0.0–1.0)
Basophils Absolute: 0.05 K/UL (ref 0.00–0.10)
Eosinophils %: 4.1 % (ref 0.0–7.0)
Eosinophils Absolute: 0.3 K/UL (ref 0.00–0.40)
Hematocrit: 34.3 % — ABNORMAL LOW (ref 35.0–47.0)
Hemoglobin: 11.6 g/dL (ref 11.5–16.0)
Immature Granulocytes %: 0.1 % (ref 0.0–0.5)
Immature Granulocytes Absolute: 0.01 K/UL (ref 0.00–0.04)
Lymphocytes %: 32.7 % (ref 12.0–49.0)
Lymphocytes Absolute: 2.39 K/UL (ref 0.80–3.50)
MCH: 29.9 pg (ref 26.0–34.0)
MCHC: 33.8 g/dL (ref 30.0–36.5)
MCV: 88.4 FL (ref 80.0–99.0)
MPV: 10.1 FL (ref 8.9–12.9)
Monocytes %: 6.3 % (ref 5.0–13.0)
Monocytes Absolute: 0.46 K/UL (ref 0.00–1.00)
Neutrophils %: 56.1 % (ref 32.0–75.0)
Neutrophils Absolute: 4.09 K/UL (ref 1.80–8.00)
Nucleated RBCs: 0 /100{WBCs}
Platelets: 271 K/uL (ref 150–400)
RBC: 3.88 M/uL (ref 3.80–5.20)
RDW: 12.6 % (ref 11.5–14.5)
WBC: 7.3 K/uL (ref 3.6–11.0)
nRBC: 0 K/uL (ref 0.00–0.01)

## 2024-10-14 LAB — URINALYSIS WITH MICROSCOPIC
BACTERIA, URINE: NEGATIVE /HPF
Bilirubin, Urine: NEGATIVE
Glucose, Ur: NEGATIVE mg/dL
Ketones, Urine: NEGATIVE mg/dL
Leukocyte Esterase, Urine: NEGATIVE
Nitrite, Urine: NEGATIVE
Protein, UA: NEGATIVE mg/dL
Specific Gravity, UA: 1.023
Urobilinogen, Urine: 1 EU/dL (ref 0.2–1.0)
pH, Urine: 5.5 (ref 5.0–8.0)

## 2024-10-14 LAB — COMPREHENSIVE METABOLIC PANEL
ALT: 10 U/L (ref 10–35)
AST: 13 U/L (ref 10–35)
Albumin/Globulin Ratio: 1 — ABNORMAL LOW (ref 1.1–2.2)
Albumin: 3.9 g/dL (ref 3.5–5.2)
Alk Phosphatase: 54 U/L (ref 35–104)
Anion Gap: 9 mmol/L (ref 2–14)
BUN/Creatinine Ratio: 15 (ref 12–20)
BUN: 11 mg/dL (ref 6–20)
CO2: 26 mmol/L (ref 20–29)
Calcium: 9.4 mg/dL (ref 8.6–10.0)
Chloride: 105 mmol/L (ref 98–107)
Creatinine: 0.74 mg/dL (ref 0.60–1.00)
Est, Glom Filt Rate: >90 ml/min/1.73m2 (ref >59)
Globulin: 3.9 g/dL (ref 2.0–4.0)
Glucose: 94 mg/dL (ref 65–100)
Potassium: 3.7 mmol/L (ref 3.5–5.1)
Sodium: 140 mmol/L (ref 136–145)
Total Bilirubin: 0.4 mg/dL (ref 0.0–1.2)
Total Protein: 7.9 g/dL (ref 6.4–8.3)

## 2024-10-14 LAB — POC PREGNANCY UR-QUAL: Preg Test, Ur: NEGATIVE

## 2024-10-14 LAB — HCG, QUANTITATIVE, PREGNANCY: hCG Quant: 5 m[IU]/mL

## 2024-10-14 NOTE — ED Provider Notes (Signed)
 "[facility]  EMERGENCY DEPARTMENT ENCOUNTER       Pt Name: Kaitlyn Fischer  MRN: [id number]  Birthdate 02-08-1991  Date of evaluation: 10/14/2024  Provider: Waddell Earthly, PA   PCP: Joshua Lynwood Dallas Mickey., MD  Note Started: 11:48 AM EST 10/14/24     CHIEF COMPLAINT       Chief Complaint   Patient presents with    Vaginal Bleeding     Patient ambulatory to triage with LMP 10/15, US  scheduled for 12/11. Patient reports she is approximately [redacted] weeks pregnant. Patient reports dark red spotting, patient reports PTA she went to use the bathroom it became a flow. OBGYN [facility], 2nd pregnancy         HISTORY OF PRESENT ILLNESS: 1 or more elements      History From: Patient  None     Kaitlyn Fischer is a 33 y.o. female who presents to the ED for evaluation of vaginal bleeding.  Patient states she had a positive pregnancy test around 6 days ago the urgent care.  States her last menstrual period was 10/15.  States she had some dark red spotting today.  Has not yet had confirmatory ultrasound.  She denies any abdominal pain or cramping at this time.  Patient states she is asymptomatic at this time.     Nursing Notes were all reviewed and agreed with or any disagreements were addressed in the HPI.     REVIEW OF SYSTEMS      Review of Systems   All other systems reviewed and are negative.       Positives and Pertinent negatives as per HPI.    PAST HISTORY     Past Medical History:  Past Medical History:   Diagnosis Date    Second hand smoke exposure        Past Surgical History:  History reviewed. No pertinent surgical history.    Family History:  History reviewed. No pertinent family history.    Social History:  Social History     Substance Use Topics    Alcohol use: Never       Allergies:  Allergies   Allergen Reactions    Apple Swelling     Lips swell per pt    Hydrocodone Swelling     Per pt, lips swell when she has taken           PHYSICAL EXAM      ED Triage Vitals   Encounter Vitals Group      BP  10/14/24 1016 113/80      Girls Systolic BP Percentile --       Girls Diastolic BP Percentile --       Boys Systolic BP Percentile --       Boys Diastolic BP Percentile --       Pulse 10/14/24 1016 85      Respirations 10/14/24 1016 16      Temp 10/14/24 1016 97.5 F (36.4 C)      Temp src --       SpO2 10/14/24 1016 100 %      Weight - Scale 10/14/24 1106 74.8 kg (165 lb)      Height 10/14/24 1106 1.6 m (5' 3)      Head Circumference --       Peak Flow --       Pain Score --       Pain Loc --       Pain Education --  Exclude from Growth Chart --         Physical Exam  Constitutional:       Appearance: Normal appearance.   HENT:      Head: Normocephalic and atraumatic.   Eyes:      Conjunctiva/sclera: Conjunctivae normal.   Cardiovascular:      Rate and Rhythm: Normal rate and regular rhythm.   Pulmonary:      Effort: Pulmonary effort is normal.      Breath sounds: Normal breath sounds.   Abdominal:      General: There is no distension.      Palpations: Abdomen is soft.      Tenderness: There is no abdominal tenderness. There is no guarding or rebound.   Musculoskeletal:         General: Normal range of motion.      Cervical back: Normal range of motion.   Skin:     General: Skin is warm and dry.   Neurological:      General: No focal deficit present.      Mental Status: She is alert and oriented to person, place, and time.   Psychiatric:         Mood and Affect: Mood normal.         Behavior: Behavior normal.          DIAGNOSTIC RESULTS   LABS:     Recent Results (from the past 24 hours)   CBC with Auto Differential    Collection Time: 10/14/24 10:33 AM   Result Value Ref Range    WBC 7.3 3.6 - 11.0 K/uL    RBC 3.88 3.80 - 5.20 M/uL    Hemoglobin 11.6 11.5 - 16.0 g/dL    Hematocrit 65.6 (L) 35.0 - 47.0 %    MCV 88.4 80.0 - 99.0 FL    MCH 29.9 26.0 - 34.0 PG    MCHC 33.8 30.0 - 36.5 g/dL    RDW 87.3 88.4 - 85.4 %    Platelets 271 150 - 400 K/uL    MPV 10.1 8.9 - 12.9 FL    Nucleated RBCs 0.0 0 PER 100 WBC     nRBC 0.00 0.00 - 0.01 K/uL    Neutrophils % 56.1 32.0 - 75.0 %    Lymphocytes % 32.7 12.0 - 49.0 %    Monocytes % 6.3 5.0 - 13.0 %    Eosinophils % 4.1 0.0 - 7.0 %    Basophils % 0.7 0.0 - 1.0 %    Immature Granulocytes % 0.1 0.0 - 0.5 %    Neutrophils Absolute 4.09 1.80 - 8.00 K/UL    Lymphocytes Absolute 2.39 0.80 - 3.50 K/UL    Monocytes Absolute 0.46 0.00 - 1.00 K/UL    Eosinophils Absolute 0.30 0.00 - 0.40 K/UL    Basophils Absolute 0.05 0.00 - 0.10 K/UL    Immature Granulocytes Absolute 0.01 0.00 - 0.04 K/UL    Differential Type AUTOMATED     Comprehensive Metabolic Panel    Collection Time: 10/14/24 10:33 AM   Result Value Ref Range    Sodium 140 136 - 145 mmol/L    Potassium 3.7 3.5 - 5.1 mmol/L    Chloride 105 98 - 107 mmol/L    CO2 26 20 - 29 mmol/L    Anion Gap 9 2 - 14 mmol/L    Glucose 94 65 - 100 mg/dL    BUN 11 6 - 20 MG/DL    Creatinine 9.25 9.39 -  1.00 MG/DL    BUN/Creatinine Ratio 15 12 - 20      Est, Glom Filt Rate >90 >59 ml/min/1.83m2    Calcium 9.4 8.6 - 10.0 MG/DL    Total Bilirubin 0.4 0.0 - 1.2 MG/DL    ALT 10 10 - 35 U/L    AST 13 10 - 35 U/L    Alk Phosphatase 54 35 - 104 U/L    Total Protein 7.9 6.4 - 8.3 g/dL    Albumin 3.9 3.5 - 5.2 g/dL    Globulin 3.9 2.0 - 4.0 g/dL    Albumin/Globulin Ratio 1.0 (L) 1.1 - 2.2     Urinalysis with Microscopic    Collection Time: 10/14/24 10:33 AM   Result Value Ref Range    Color, UA YELLOW/STRAW      Appearance CLEAR CLEAR      Specific Gravity, UA 1.023      pH, Urine 5.5 5.0 - 8.0      Protein, UA Negative NEG mg/dL    Glucose, Ur Negative NEG mg/dL    Ketones, Urine Negative NEG mg/dL    Bilirubin, Urine Negative NEG      Blood, Urine LARGE (A) NEG      Urobilinogen, Urine 1.0 0.2 - 1.0 EU/dL    Nitrite, Urine Negative NEG      Leukocyte Esterase, Urine Negative NEG      WBC, UA 0-4 0 - 4 /hpf    RBC, UA 20-50 0 - 5 /hpf    Epithelial Cells, UA MODERATE (A) FEW /lpf    BACTERIA, URINE Negative NEG /hpf    Hyaline Casts, UA 0-2 0 - 2 /lpf   HCG,  Quantitative, Pregnancy    Collection Time: 10/14/24 10:33 AM   Result Value Ref Range    hCG Quant 5 MIU/ML   POC Pregnancy Urine Qual    Collection Time: 10/14/24 10:38 AM   Result Value Ref Range    Preg Test, Ur Negative NEG         EKG: When ordered, EKG's are interpreted by the Emergency Department Physician in the absence of a cardiologist.  Please see their note for interpretation of EKG.      RADIOLOGY:  Non-plain film images such as CT, Ultrasound and MRI are read by the radiologist. Plain radiographic images are visualized and preliminarily interpreted by the ED Provider with the below findings:          Interpretation per the Radiologist below, if available at the time of this note:     No orders to display        PROCEDURES   Unless otherwise noted below, none  Procedures     CRITICAL CARE TIME       EMERGENCY DEPARTMENT COURSE and DIFFERENTIAL DIAGNOSIS/MDM   Vitals:    Vitals:    10/14/24 1016 10/14/24 1106   BP: 113/80    Pulse: 85    Resp: 16    Temp: 97.5 F (36.4 C)    SpO2: 100%    Weight:  74.8 kg (165 lb)   Height:  1.6 m (5' 3)        Patient was given the following medications:  Medications - No data to display    CONSULTS: (Who and What was discussed)  None    Chronic Conditions: as noted above     Records Reviewed (source and summary of external records): Nursing Notes and Old Medical Records    MDM (CC/HPI Summary, DDx,  ED Course, Reassessment, Disposition Considerations -Tests not done, Shared Decision Making, Pt Expectation of Test or Tx.):     Patient is a 33 year old female who presents to the ED for evaluation of vaginal bleeding.  Patient states she had a positive pregnancy test around 6 days ago the urgent care.  States her last menstrual period was 10/15.  States she had some dark red spotting today.  Has not yet had confirmatory ultrasound.  She denies any abdominal pain or cramping at this time.  Patient states she is asymptomatic at this time.    Patient states she had an  outpatient point-of-care pregnancy test at urgent care 6 days ago.  States she presents today with vaginal spotting.  She denies any abdominal pain or cramping.    Point-of-care pregnancy test is negative.    Her hCG is 6.    Benign abdominal exam without any tenderness.  Shared decision making performed and care plan created together, discussed with patient this could be very early pregnancy, but with an hCG of 6 it seems less likely that she would be [redacted] weeks pregnant and having a miscarriage with a negative point-of-care pregnancy test.  I did advise she follow-up with her OB/GYN for repeat hCG and further evaluation she is agreeable with this plan will defer further labs or imaging here today.    ED Course as of 10/14/24 1148   Fri Oct 14, 2024   1135 Suspect blood in urine likely secondary to vaginal bleeding  [TL]   1146 POC preg negative and HCG 5     No abdominal pain at this time and no tenderness on exam     Advised continue monitoring pregnancy tests and follow-up with OBGYN     Do not feel further imaging needed at this time, patient agreeable with this plan.  [TL]      ED Course User Index  [TL] Guy Birmingham, PA           FINAL IMPRESSION     1. Vaginal bleeding    2. Urine pregnancy test negative          DISPOSITION/PLAN   DISPOSITION                 Discharge Note: The patient is stable for discharge home. The signs, symptoms, diagnosis, and discharge instructions have been discussed, understanding conveyed, and agreed upon. The patient is to follow up as recommended or return to ER should their symptoms worsen.      PATIENT REFERRED TO:  No follow-up provider specified.      DISCHARGE MEDICATIONS:     Medication List        ASK your doctor about these medications      acetaminophen  500 MG tablet  Commonly known as: TYLENOL   Take 2 tablets by mouth every 6 hours as needed for Pain     butalbital -APAP-caffeine  50-300-40 MG Caps per capsule  Commonly known as: Fioricet  Take 1 capsule by mouth every 6  hours as needed for Headaches     cetirizine  10 MG tablet  Commonly known as: ZYRTEC   Take 1 tablet by mouth daily     ibuprofen  600 MG tablet  Commonly known as: ADVIL ;MOTRIN   Take 1 tablet by mouth 3 times daily as needed for Pain     ondansetron  4 MG disintegrating tablet  Commonly known as: ZOFRAN -ODT  Take 1 tablet by mouth every 8 hours as needed for Nausea or Vomiting  DISCONTINUED MEDICATIONS:  Current Discharge Medication List              I am the Primary Clinician of Record.   Waddell Earthly, PA (electronically signed)    (Please note that parts of this dictation were completed with voice recognition software. Quite often unanticipated grammatical, syntax, homophones, and other interpretive errors are inadvertently transcribed by the computer software. Please disregards these errors. Please excuse any errors that have escaped final proofreading.)         Earthly Waddell, [place]  10/18/24 1644    "

## 2024-10-14 NOTE — Discharge Instructions (Signed)
 "Thank You!    It was a pleasure taking care of you in our Emergency Department today. We know that when you come to [facility], you are entrusting us  with your health, comfort, and safety. Our clinicians honor that trust, and truly appreciate the opportunity to care for you and your loved ones.    If you receive a survey about your Emergency Department experience today, please fill it out.  We value your feedback. Thank you.      Waddell Earthly PA-C    ___________________________________  I have included a copy of your lab results and/or radiologic studies from today's visit so you can have them easily available at your follow-up visit.   Recent Results (from the past 12 hours)   CBC with Auto Differential    Collection Time: 10/14/24 10:33 AM   Result Value Ref Range    WBC 7.3 3.6 - 11.0 K/uL    RBC 3.88 3.80 - 5.20 M/uL    Hemoglobin 11.6 11.5 - 16.0 g/dL    Hematocrit 65.6 (L) 35.0 - 47.0 %    MCV 88.4 80.0 - 99.0 FL    MCH 29.9 26.0 - 34.0 PG    MCHC 33.8 30.0 - 36.5 g/dL    RDW 87.3 88.4 - 85.4 %    Platelets 271 150 - 400 K/uL    MPV 10.1 8.9 - 12.9 FL    Nucleated RBCs 0.0 0 PER 100 WBC    nRBC 0.00 0.00 - 0.01 K/uL    Neutrophils % 56.1 32.0 - 75.0 %    Lymphocytes % 32.7 12.0 - 49.0 %    Monocytes % 6.3 5.0 - 13.0 %    Eosinophils % 4.1 0.0 - 7.0 %    Basophils % 0.7 0.0 - 1.0 %    Immature Granulocytes % 0.1 0.0 - 0.5 %    Neutrophils Absolute 4.09 1.80 - 8.00 K/UL    Lymphocytes Absolute 2.39 0.80 - 3.50 K/UL    Monocytes Absolute 0.46 0.00 - 1.00 K/UL    Eosinophils Absolute 0.30 0.00 - 0.40 K/UL    Basophils Absolute 0.05 0.00 - 0.10 K/UL    Immature Granulocytes Absolute 0.01 0.00 - 0.04 K/UL    Differential Type AUTOMATED     Comprehensive Metabolic Panel    Collection Time: 10/14/24 10:33 AM   Result Value Ref Range    Sodium 140 136 - 145 mmol/L    Potassium 3.7 3.5 - 5.1 mmol/L    Chloride 105 98 - 107 mmol/L    CO2 26 20 - 29 mmol/L    Anion Gap 9 2 - 14 mmol/L     Glucose 94 65 - 100 mg/dL    BUN 11 6 - 20 MG/DL    Creatinine 9.25 9.39 - 1.00 MG/DL    BUN/Creatinine Ratio 15 12 - 20      Est, Glom Filt Rate >90 >59 ml/min/1.60m2    Calcium 9.4 8.6 - 10.0 MG/DL    Total Bilirubin 0.4 0.0 - 1.2 MG/DL    ALT 10 10 - 35 U/L    AST 13 10 - 35 U/L    Alk Phosphatase 54 35 - 104 U/L    Total Protein 7.9 6.4 - 8.3 g/dL    Albumin 3.9 3.5 - 5.2 g/dL    Globulin 3.9 2.0 - 4.0 g/dL    Albumin/Globulin Ratio 1.0 (L) 1.1 - 2.2     Urinalysis with Microscopic  Collection Time: 10/14/24 10:33 AM   Result Value Ref Range    Color, UA YELLOW/STRAW      Appearance CLEAR CLEAR      Specific Gravity, UA 1.023      pH, Urine 5.5 5.0 - 8.0      Protein, UA Negative NEG mg/dL    Glucose, Ur Negative NEG mg/dL    Ketones, Urine Negative NEG mg/dL    Bilirubin, Urine Negative NEG      Blood, Urine LARGE (A) NEG      Urobilinogen, Urine 1.0 0.2 - 1.0 EU/dL    Nitrite, Urine Negative NEG      Leukocyte Esterase, Urine Negative NEG      WBC, UA 0-4 0 - 4 /hpf    RBC, UA 20-50 0 - 5 /hpf    Epithelial Cells, UA MODERATE (A) FEW /lpf    BACTERIA, URINE Negative NEG /hpf    Hyaline Casts, UA 0-2 0 - 2 /lpf   HCG, Quantitative, Pregnancy    Collection Time: 10/14/24 10:33 AM   Result Value Ref Range    hCG Quant 5 MIU/ML   POC Pregnancy Urine Qual    Collection Time: 10/14/24 10:38 AM   Result Value Ref Range    Preg Test, Ur Negative NEG         No orders to display     @CT48 @    "

## 2024-11-13 ENCOUNTER — Emergency Department: Admit: 2024-11-13 | Payer: BLUE CROSS/BLUE SHIELD | Primary: Obstetrics & Gynecology

## 2024-11-13 ENCOUNTER — Inpatient Hospital Stay
Admit: 2024-11-13 | Discharge: 2024-11-13 | Disposition: A | Discharge status: Home / Self Care | Payer: BLUE CROSS/BLUE SHIELD | Arrived: VH

## 2024-11-13 ENCOUNTER — Emergency Department: Payer: BLUE CROSS/BLUE SHIELD | Primary: Obstetrics & Gynecology

## 2024-11-13 LAB — POC PREGNANCY UR-QUAL
HCG, Urine, POC: NEGATIVE
Lot Number: 1134519

## 2024-11-13 MED ORDER — LIDOCAINE 4 % EX PTCH
4 | MEDICATED_PATCH | Freq: Every day | CUTANEOUS | 0 refills | 15.00000 days | Status: AC
Start: 2024-11-13 — End: 2024-12-13

## 2024-11-13 MED ORDER — METHYLPREDNISOLONE 4 MG PO TBPK
4 | PACK | ORAL | 0 refills | 6.00000 days | Status: AC
Start: 2024-11-13 — End: 2024-11-19

## 2024-11-13 MED ORDER — KETOROLAC TROMETHAMINE 10 MG PO TABS
10 | ORAL_TABLET | Freq: Four times a day (QID) | ORAL | 0 refills | 5.00000 days | Status: AC | PRN
Start: 2024-11-13 — End: ?

## 2024-11-13 MED ORDER — KETOROLAC TROMETHAMINE 30 MG/ML IJ SOLN
30 | INTRAMUSCULAR | Status: AC
Start: 2024-11-13 — End: 2024-11-13
  Administered 2024-11-13: 19:00:00 30 mg via INTRAMUSCULAR

## 2024-11-13 MED ORDER — FLUTICASONE PROPIONATE 50 MCG/ACT NA SUSP
50 | Freq: Every day | NASAL | 0 refills | 42.00000 days | Status: AC
Start: 2024-11-13 — End: ?

## 2024-11-13 MED ORDER — LIDOCAINE 4 % EX PTCH
4 | CUTANEOUS | Status: DC
Start: 2024-11-13 — End: 2024-11-13
  Administered 2024-11-13: 19:00:00 1 via TRANSDERMAL

## 2024-11-13 MED FILL — SALONPAS PAIN RELIEVING 4 % EX PTCH: 4 % | CUTANEOUS | Qty: 1 | Fill #0

## 2024-11-13 MED FILL — KETOROLAC TROMETHAMINE 30 MG/ML IJ SOLN: 30 mg/mL | INTRAMUSCULAR | Qty: 1 | Fill #0

## 2024-11-13 NOTE — Discharge Instructions (Addendum)
"  Refer to MyChart for pending test results. For rib cage pain, Take Toradol  and Tylenol  in an alternating fashion, taking 1000 milligrams of Tylenol  every 8 hours and Toradol  every 6 hours.  Apply lidocaine patches to area of most tenderness on your rib cage daily.  For cough, take steroids as directed and consider using Flonase and Zyrtec  for allergy related process to prevent cough.  Follow-up with PCP.  Return to ER with acute worsening symptoms such as chest pain, shortness of breath, abdominal pain, or other acute complaints. Thank you for choosing [facility] to be a part of your care today.    "

## 2024-11-13 NOTE — ED Notes (Signed)
 DC info reviewed with patient, all questions answered. Patient well-appearing at time of discharge and vital signs stable. Ambulatory out of ED at this time.

## 2024-11-13 NOTE — ED Provider Notes (Signed)
 "[facility]  EMERGENCY DEPARTMENT ENCOUNTER       Pt Name: Kaitlyn Fischer  MRN: [id number]  Birthdate 04-29-1991  Date of evaluation: 11/13/2024  Provider: Harlene DELENA Search, PA-C   PCP: Joshua Lynwood Dallas Mickey., MD       CHIEF COMPLAINT       Chief Complaint   Patient presents with    Rib Pain (injury)     Patient ambulatory to triage with left sided rib pain x 1 week, patient reports a cough x 1 month. Patient reports pain with a deep breath        HISTORY OF PRESENT ILLNESS: 1 or more elements      History From: Patient, History limited by: none     Kaitlyn Fischer is a 33 y.o. female who is otherwise healthy presenting for evaluation of left-sided rib cage pain for the past week with associated dry cough for the past month.  Patient states it hurts her ribs to take a deep breath, but she does not feel short of breath.  Denies chest pain.  Denies any unilateral leg swelling, hemoptysis, recent surgery or trauma, prior PE/DVT, or hormone use.       Please See MDM for Additional Details of the HPI/PMH  Nursing Notes were all reviewed and agreed with or any disagreements were addressed in the HPI.     REVIEW OF SYSTEMS        Positives and Pertinent negatives as per HPI.    PAST HISTORY     Past Medical History:  Past Medical History:   Diagnosis Date    Second hand smoke exposure        Past Surgical History:  History reviewed. No pertinent surgical history.    Family History:  History reviewed. No pertinent family history.    Social History:  Social History     Substance Use Topics    Alcohol use: Never       Allergies:  Allergies   Allergen Reactions    Apple Swelling     Lips swell per pt    Hydrocodone Swelling     Per pt, lips swell when she has taken       CURRENT MEDICATIONS      Discharge Medication List as of 11/13/2024  3:28 PM        CONTINUE these medications which have NOT CHANGED    Details   ondansetron  (ZOFRAN -ODT) 4 MG disintegrating tablet Take 1 tablet by mouth every 8  hours as needed for Nausea or Vomiting, Disp-10 tablet, R-0Normal      acetaminophen  (TYLENOL ) 500 MG tablet Take 2 tablets by mouth every 6 hours as needed for Pain, Disp-30 tablet, R-0Normal      ibuprofen  (ADVIL ;MOTRIN ) 600 MG tablet Take 1 tablet by mouth 3 times daily as needed for Pain, Disp-30 tablet, R-0Normal      butalbital -APAP-caffeine  (FIORICET) 50-300-40 MG CAPS per capsule Take 1 capsule by mouth every 6 hours as needed for Headaches, Disp-30 capsule, R-0Normal      cetirizine  (ZYRTEC ) 10 MG tablet Take 1 tablet by mouth daily, Disp-30 tablet, R-0Normal             SCREENINGS               No data recorded            PHYSICAL EXAM      ED Triage Vitals   Encounter Vitals Group      BP 11/13/24  1224 108/72      Girls Systolic BP Percentile --       Girls Diastolic BP Percentile --       Boys Systolic BP Percentile --       Boys Diastolic BP Percentile --       Pulse 11/13/24 1224 87      Respirations 11/13/24 1224 16      Temp 11/13/24 1224 98.2 F (36.8 C)      Temp Source 11/13/24 1224 Oral      SpO2 11/13/24 1224 99 %      Weight - Scale 11/13/24 1223 74.8 kg (165 lb)      Height --       Head Circumference --       Peak Flow --       Pain Score --       Pain Loc --       Pain Education --       Exclude from Growth Chart --         Physical Exam  Constitutional:       General: She is not in acute distress.     Appearance: Normal appearance. She is not toxic-appearing.   HENT:      Head: Normocephalic and atraumatic.      Nose: Nose normal. No congestion or rhinorrhea.      Mouth/Throat:      Mouth: Mucous membranes are moist.      Pharynx: Oropharynx is clear.   Eyes:      Extraocular Movements: Extraocular movements intact.      Conjunctiva/sclera: Conjunctivae normal.      Pupils: Pupils are equal, round, and reactive to light.   Cardiovascular:      Rate and Rhythm: Normal rate and regular rhythm.      Heart sounds: Normal heart sounds. No murmur heard.     No friction rub. No gallop.    Pulmonary:      Effort: Pulmonary effort is normal. No respiratory distress.      Breath sounds: Normal breath sounds. No stridor. No wheezing, rhonchi or rales.      Comments: Tenderness to palpation along the left side of the rib cage along the axillary line as depicted above.  Chest:      Chest wall: Tenderness present.       Abdominal:      General: Abdomen is flat. There is no distension.      Palpations: Abdomen is soft.   Musculoskeletal:         General: Normal range of motion.      Cervical back: Normal range of motion and neck supple.   Skin:     General: Skin is warm and dry.      Capillary Refill: Capillary refill takes less than 2 seconds.   Neurological:      General: No focal deficit present.      Mental Status: She is alert and oriented to person, place, and time.   Psychiatric:         Behavior: Behavior normal.          DIAGNOSTIC RESULTS   LABS:     Recent Results (from the past 24 hours)   EKG 12 Lead    Collection Time: 11/13/24 12:35 PM   Result Value Ref Range    Ventricular Rate 76 BPM    Atrial Rate 76 BPM    P-R Interval 148 ms    QRS Duration  74 ms    Q-T Interval 360 ms    QTc Calculation (Bazett) 405 ms    P Axis 54 degrees    R Axis 89 degrees    T Axis 33 degrees    Diagnosis       Normal sinus rhythm  Low voltage QRS  Borderline ECG  When compared with ECG of 04-Feb-2010 12:12,  No significant change was found     POC Pregnancy Urine Qual    Collection Time: 11/13/24  1:48 PM   Result Value Ref Range    HCG, Urine, POC Negative Negative    Lot Number [id number]     Positive QC Pass/Fail Pass     Negative QC Pass/Fail Pass        EKG: If performed, independent interpretation documented below in the MDM section    RADIOLOGY:  Non-plain film images such as CT, Ultrasound and MRI are read by the radiologist. Plain radiographic images are visualized and preliminarily interpreted by the ED Provider with the findings documented in the MDM section.     Interpretation per the Radiologist below,  if available at the time of this note:     XR RIBS LEFT INCLUDE CHEST (MIN 3 VIEWS)   Final Result      No appreciable acute left rib fractures.         Electronically signed by GAY SKIFF           PROCEDURES   Unless otherwise noted below, none  Procedures     CRITICAL CARE TIME   N/a    EMERGENCY DEPARTMENT COURSE and DIFFERENTIAL DIAGNOSIS/MDM   Vitals:    Vitals:    11/13/24 1223 11/13/24 1224   BP:  108/72   Pulse:  87   Resp:  16   Temp:  98.2 F (36.8 C)   TempSrc:  Oral   SpO2:  99%   Weight: 74.8 kg (165 lb)         Patient was given the following medications:  Medications   lidocaine 4 % external patch 1 patch (1 patch TransDERmal Patch Applied 11/13/24 1359)   ketorolac  (TORADOL ) injection 30 mg (30 mg IntraMUSCular Given 11/13/24 1359)       Medical Decision Making  Amount and/or Complexity of Data Reviewed  Labs: ordered.    Risk  OTC drugs.  Prescription drug management.      Patient is a 33 year old female presenting for evaluation of rib cage pain for the past week with cough that has been present for the past month.  Will obtain chest ray including rib series to assess for any rib fractures, PNA, or other causes of cough/pain.  Will obtain EKG to screen for ACS.  Patient is PERC 0, low suspicion of PE/DVT.  Pain is reproducible on palpation.  Will give analgesics as needed for rib cage pain.    CLINICAL MANAGEMENT TOOLS:  N/a      ED Course as of 11/13/24 1535   Sun Nov 13, 2024   1516 Reassessed patient who states she feels better after the medication.  No obvious rib fractures or acute lung abnormalities such as PTX, PNA on my independent interpretation pending final radiologist review that would necessitate patient staying in the ED for results.  Advised her to refer to MyChart for pending test results [JM]      ED Course User Index  [JM] Joesph Harlene LABOR, PA-C         Heart Score: n/a  NIHSS: n/a  Social determinants of health that will affect patient's care are:   N/a    FINAL IMPRESSION      1. Rib pain on left side    2. Subacute cough          DISPOSITION/PLAN   Kaitlyn Fischer  results have been reviewed with her.  She has been counseled regarding her diagnosis, treatment, and plan.  She verbally conveys understanding and agreement of the signs, symptoms, diagnosis, treatment and prognosis and additionally agrees to follow up as discussed.  She also agrees with the care-plan and conveys that all of her questions have been answered.  I have also provided discharge instructions for her that include: educational information regarding their diagnosis and treatment, and list of reasons why they would want to return to the ED prior to their follow-up appointment, should her condition change.     CLINICAL IMPRESSION    Discharge Note: The patient is stable for discharge home. The signs, symptoms, diagnosis, and discharge instructions have been discussed, understanding conveyed, and agreed upon. The patient is to follow up as recommended or return to ER should their symptoms worsen.      PATIENT REFERRED TO:  Joshua Lynwood Dallas Mickey., MD  [address] [facility]  [address] [address] [place] [address]  [phone]             DISCHARGE MEDICATIONS:     Medication List        START taking these medications      fluticasone 50 MCG/ACT nasal spray  Commonly known as: FLONASE  2 sprays by Each Nostril route daily     ketorolac  10 MG tablet  Commonly known as: TORADOL   Take 1 tablet by mouth every 6 hours as needed for Pain     lidocaine 4 % external patch  Place 1 patch onto the skin daily     methylPREDNISolone 4 MG tablet  Commonly known as: MEDROL (PAK)  Take by mouth.            ASK your doctor about these medications      acetaminophen  500 MG tablet  Commonly known as: TYLENOL   Take 2 tablets by mouth every 6 hours as needed for Pain     butalbital -APAP-caffeine  50-300-40 MG Caps per capsule  Commonly known as: Fioricet  Take 1 capsule by mouth every 6 hours as needed for Headaches     cetirizine  10 MG  tablet  Commonly known as: ZYRTEC   Take 1 tablet by mouth daily     ibuprofen  600 MG tablet  Commonly known as: ADVIL ;MOTRIN   Take 1 tablet by mouth 3 times daily as needed for Pain     ondansetron  4 MG disintegrating tablet  Commonly known as: ZOFRAN -ODT  Take 1 tablet by mouth every 8 hours as needed for Nausea or Vomiting               Where to Get Your Medications        These medications were sent to [facility] [address] [place] - [address] - P [phone] - F [phone]  [address], [place] [address]      Phone: [phone]   fluticasone 50 MCG/ACT nasal spray  ketorolac  10 MG tablet  lidocaine 4 % external patch  methylPREDNISolone 4 MG tablet           DISCONTINUED MEDICATIONS:  Discharge Medication List as of 11/13/2024  3:28 PM  I have seen and evaluated the patient autonomously. My supervision physician was on site and available for consultation if needed.    I am the Primary Clinician of Record.   Harlene DELENA Search, PA-C (electronically signed)    (Please note that parts of this dictation were completed with voice recognition software. Quite often unanticipated grammatical, syntax, homophones, and other interpretive errors are inadvertently transcribed by the computer software. Please disregards these errors. Please excuse any errors that have escaped final proofreading.)         Search Harlene DELENA DEVONNA  11/13/24 1535    "

## 2024-12-08 LAB — EKG 12-LEAD
Atrial Rate: 76 {beats}/min
Diagnosis: NORMAL
P Axis: 54 degrees
P-R Interval: 148 ms
Q-T Interval: 360 ms
QRS Duration: 74 ms
QTc Calculation (Bazett): 405 ms
R Axis: 89 degrees
T Axis: 33 degrees
Ventricular Rate: 76 {beats}/min
